# Patient Record
Sex: Female | Born: 1950 | Race: White | Hispanic: No | Marital: Married | Smoking: Former smoker
Health system: Southern US, Community
[De-identification: ages and names within clinical notes are randomized; demographics above are authoritative.]

## PROBLEM LIST (undated history)

## (undated) DIAGNOSIS — G4733 Obstructive sleep apnea (adult) (pediatric): Secondary | ICD-10-CM

## (undated) DIAGNOSIS — I10 Essential (primary) hypertension: Secondary | ICD-10-CM

## (undated) DIAGNOSIS — I493 Ventricular premature depolarization: Secondary | ICD-10-CM

## (undated) DIAGNOSIS — G8929 Other chronic pain: Secondary | ICD-10-CM

## (undated) DIAGNOSIS — M791 Myalgia, unspecified site: Secondary | ICD-10-CM

## (undated) DIAGNOSIS — M549 Dorsalgia, unspecified: Secondary | ICD-10-CM

## (undated) DIAGNOSIS — N939 Abnormal uterine and vaginal bleeding, unspecified: Secondary | ICD-10-CM

## (undated) DIAGNOSIS — J300 Vasomotor rhinitis: Secondary | ICD-10-CM

## (undated) DIAGNOSIS — R600 Localized edema: Secondary | ICD-10-CM

## (undated) DIAGNOSIS — M797 Fibromyalgia: Secondary | ICD-10-CM

## (undated) DIAGNOSIS — R0789 Other chest pain: Secondary | ICD-10-CM

## (undated) DIAGNOSIS — K449 Diaphragmatic hernia without obstruction or gangrene: Secondary | ICD-10-CM

## (undated) DIAGNOSIS — R002 Palpitations: Secondary | ICD-10-CM

## (undated) DIAGNOSIS — E7800 Pure hypercholesterolemia, unspecified: Secondary | ICD-10-CM

## (undated) DIAGNOSIS — G44019 Episodic cluster headache, not intractable: Secondary | ICD-10-CM

## (undated) HISTORY — DX: Abnormal uterine and vaginal bleeding, unspecified: N93.9

## (undated) HISTORY — PX: LAPAROSCOPIC CHOLECYSTECTOMY: SUR755

## (undated) HISTORY — DX: Fibromyalgia: M79.7

## (undated) HISTORY — PX: HERNIA REPAIR: SHX51

## (undated) HISTORY — DX: Ventricular premature depolarization: I49.3

## (undated) HISTORY — PX: BACK SURGERY: SHX140

## (undated) HISTORY — DX: Essential (primary) hypertension: I10

## (undated) HISTORY — DX: Other chronic pain: G89.29

## (undated) HISTORY — PX: KNEE SURGERY: SHX244

## (undated) HISTORY — PX: THUMB ARTHROSCOPY: SHX2509

## (undated) HISTORY — PX: ABDOMINAL HYSTERECTOMY: SUR658

## (undated) HISTORY — DX: Diaphragmatic hernia without obstruction or gangrene: K44.9

## (undated) HISTORY — PX: DILATION AND CURETTAGE OF UTERUS: SHX78

## (undated) HISTORY — DX: Pure hypercholesterolemia, unspecified: E78.00

## (undated) HISTORY — DX: Dorsalgia, unspecified: M54.9

---
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
## 1998-05-21 ENCOUNTER — Ambulatory Visit (HOSPITAL_COMMUNITY): Admission: RE | Admit: 1998-05-21 | Discharge: 1998-05-21 | Payer: Self-pay | Admitting: Gastroenterology

## 1998-06-20 ENCOUNTER — Other Ambulatory Visit: Admission: RE | Admit: 1998-06-20 | Discharge: 1998-06-20 | Payer: Self-pay | Admitting: *Deleted

## 1999-01-15 ENCOUNTER — Other Ambulatory Visit: Admission: RE | Admit: 1999-01-15 | Discharge: 1999-01-15 | Payer: Self-pay | Admitting: *Deleted

## 2000-08-11 ENCOUNTER — Other Ambulatory Visit: Admission: RE | Admit: 2000-08-11 | Discharge: 2000-08-11 | Payer: Self-pay | Admitting: *Deleted

## 2000-09-22 ENCOUNTER — Ambulatory Visit (HOSPITAL_COMMUNITY): Admission: RE | Admit: 2000-09-22 | Discharge: 2000-09-22 | Payer: Self-pay | Admitting: Internal Medicine

## 2000-10-19 ENCOUNTER — Ambulatory Visit (HOSPITAL_COMMUNITY): Admission: RE | Admit: 2000-10-19 | Discharge: 2000-10-19 | Payer: Self-pay | Admitting: Gastroenterology

## 2000-10-19 ENCOUNTER — Encounter: Payer: Self-pay | Admitting: Gastroenterology

## 2001-08-03 ENCOUNTER — Ambulatory Visit (HOSPITAL_COMMUNITY): Admission: RE | Admit: 2001-08-03 | Discharge: 2001-08-03 | Payer: Self-pay | Admitting: Gastroenterology

## 2001-08-03 ENCOUNTER — Encounter: Payer: Self-pay | Admitting: Gastroenterology

## 2001-08-10 ENCOUNTER — Encounter: Admission: RE | Admit: 2001-08-10 | Discharge: 2001-08-10 | Payer: Self-pay | Admitting: Gastroenterology

## 2001-08-10 ENCOUNTER — Encounter: Payer: Self-pay | Admitting: Gastroenterology

## 2001-10-12 ENCOUNTER — Other Ambulatory Visit: Admission: RE | Admit: 2001-10-12 | Discharge: 2001-10-12 | Payer: Self-pay | Admitting: *Deleted

## 2002-02-01 ENCOUNTER — Inpatient Hospital Stay (HOSPITAL_COMMUNITY): Admission: AD | Admit: 2002-02-01 | Discharge: 2002-02-05 | Payer: Self-pay | Admitting: Internal Medicine

## 2002-02-02 ENCOUNTER — Encounter: Payer: Self-pay | Admitting: Internal Medicine

## 2002-09-18 ENCOUNTER — Other Ambulatory Visit: Admission: RE | Admit: 2002-09-18 | Discharge: 2002-09-18 | Payer: Self-pay | Admitting: *Deleted

## 2003-02-18 ENCOUNTER — Encounter: Payer: Self-pay | Admitting: *Deleted

## 2003-02-20 ENCOUNTER — Encounter (INDEPENDENT_AMBULATORY_CARE_PROVIDER_SITE_OTHER): Payer: Self-pay | Admitting: Specialist

## 2003-02-20 ENCOUNTER — Inpatient Hospital Stay (HOSPITAL_COMMUNITY): Admission: AD | Admit: 2003-02-20 | Discharge: 2003-02-22 | Payer: Self-pay | Admitting: *Deleted

## 2003-07-09 ENCOUNTER — Encounter: Admission: RE | Admit: 2003-07-09 | Discharge: 2003-07-09 | Payer: Self-pay | Admitting: *Deleted

## 2003-07-31 ENCOUNTER — Encounter (INDEPENDENT_AMBULATORY_CARE_PROVIDER_SITE_OTHER): Payer: Self-pay | Admitting: Specialist

## 2003-07-31 ENCOUNTER — Ambulatory Visit (HOSPITAL_COMMUNITY): Admission: RE | Admit: 2003-07-31 | Discharge: 2003-07-31 | Payer: Self-pay | Admitting: Gastroenterology

## 2003-09-25 ENCOUNTER — Other Ambulatory Visit: Admission: RE | Admit: 2003-09-25 | Discharge: 2003-09-25 | Payer: Self-pay | Admitting: *Deleted

## 2003-12-16 ENCOUNTER — Ambulatory Visit (HOSPITAL_COMMUNITY): Admission: RE | Admit: 2003-12-16 | Discharge: 2003-12-16 | Payer: Self-pay | Admitting: Surgery

## 2003-12-16 ENCOUNTER — Ambulatory Visit (HOSPITAL_BASED_OUTPATIENT_CLINIC_OR_DEPARTMENT_OTHER): Admission: RE | Admit: 2003-12-16 | Discharge: 2003-12-16 | Payer: Self-pay | Admitting: Surgery

## 2003-12-16 ENCOUNTER — Encounter (INDEPENDENT_AMBULATORY_CARE_PROVIDER_SITE_OTHER): Payer: Self-pay | Admitting: Specialist

## 2004-10-14 ENCOUNTER — Other Ambulatory Visit: Admission: RE | Admit: 2004-10-14 | Discharge: 2004-10-14 | Payer: Self-pay | Admitting: *Deleted

## 2005-12-01 ENCOUNTER — Other Ambulatory Visit: Admission: RE | Admit: 2005-12-01 | Discharge: 2005-12-01 | Payer: Self-pay | Admitting: *Deleted

## 2006-11-09 ENCOUNTER — Ambulatory Visit: Payer: Self-pay

## 2006-12-21 ENCOUNTER — Other Ambulatory Visit: Admission: RE | Admit: 2006-12-21 | Discharge: 2006-12-21 | Payer: Self-pay | Admitting: *Deleted

## 2007-04-25 ENCOUNTER — Ambulatory Visit (HOSPITAL_COMMUNITY): Admission: RE | Admit: 2007-04-25 | Discharge: 2007-04-25 | Payer: Self-pay | Admitting: Internal Medicine

## 2007-08-21 ENCOUNTER — Encounter: Admission: RE | Admit: 2007-08-21 | Discharge: 2007-08-21 | Payer: Self-pay | Admitting: Internal Medicine

## 2007-12-21 ENCOUNTER — Other Ambulatory Visit: Admission: RE | Admit: 2007-12-21 | Discharge: 2007-12-21 | Payer: Self-pay | Admitting: Gynecology

## 2009-10-29 ENCOUNTER — Encounter: Admission: RE | Admit: 2009-10-29 | Discharge: 2009-10-29 | Payer: Self-pay | Admitting: Specialist

## 2009-12-20 ENCOUNTER — Encounter: Admission: RE | Admit: 2009-12-20 | Discharge: 2009-12-20 | Payer: Self-pay | Admitting: Specialist

## 2009-12-24 ENCOUNTER — Ambulatory Visit (HOSPITAL_COMMUNITY): Admission: AD | Admit: 2009-12-24 | Discharge: 2009-12-25 | Payer: Self-pay | Admitting: Specialist

## 2009-12-25 ENCOUNTER — Encounter (INDEPENDENT_AMBULATORY_CARE_PROVIDER_SITE_OTHER): Payer: Self-pay | Admitting: Specialist

## 2010-04-28 LAB — PROTIME-INR
INR: 0.9 (ref 0.00–1.49)
Prothrombin Time: 12.4 seconds (ref 11.6–15.2)

## 2010-04-28 LAB — COMPREHENSIVE METABOLIC PANEL
ALT: 34 U/L (ref 0–35)
AST: 23 U/L (ref 0–37)
Albumin: 3.6 g/dL (ref 3.5–5.2)
Alkaline Phosphatase: 50 U/L (ref 39–117)
BUN: 14 mg/dL (ref 6–23)
CO2: 32 mEq/L (ref 19–32)
Calcium: 8.7 mg/dL (ref 8.4–10.5)
Chloride: 97 mEq/L (ref 96–112)
Creatinine, Ser: 0.92 mg/dL (ref 0.4–1.2)
GFR calc Af Amer: >60 mL/min (ref >60)
GFR calc non Af Amer: >60 mL/min (ref >60)
Glucose, Bld: 96 mg/dL (ref 70–99)
Potassium: 3.4 mEq/L — ABNORMAL LOW (ref 3.5–5.1)
Sodium: 139 mEq/L (ref 135–145)
Total Bilirubin: 0.8 mg/dL (ref 0.3–1.2)
Total Protein: 7.1 g/dL (ref 6.0–8.3)

## 2010-04-28 LAB — BASIC METABOLIC PANEL
BUN: 11 mg/dL (ref 6–23)
CO2: 28 mEq/L (ref 19–32)
Calcium: 8.5 mg/dL (ref 8.4–10.5)
Chloride: 103 mEq/L (ref 96–112)
Creatinine, Ser: 0.84 mg/dL (ref 0.4–1.2)
GFR calc Af Amer: >60 mL/min (ref >60)
GFR calc non Af Amer: >60 mL/min (ref >60)
Glucose, Bld: 144 mg/dL — ABNORMAL HIGH (ref 70–99)
Potassium: 4.6 mEq/L (ref 3.5–5.1)
Sodium: 139 mEq/L (ref 135–145)

## 2010-04-28 LAB — URINALYSIS, ROUTINE W REFLEX MICROSCOPIC
Bilirubin Urine: NEGATIVE
Glucose, UA: NEGATIVE mg/dL
Hgb urine dipstick: NEGATIVE
Ketones, ur: NEGATIVE mg/dL
Nitrite: NEGATIVE
Protein, ur: NEGATIVE mg/dL
Specific Gravity, Urine: 1.008 (ref 1.005–1.030)
Urobilinogen, UA: 0.2 mg/dL (ref 0.0–1.0)
pH: 7 (ref 5.0–8.0)

## 2010-04-28 LAB — SURGICAL PCR SCREEN
MRSA, PCR: NEGATIVE
Staphylococcus aureus: NEGATIVE

## 2010-04-28 LAB — CBC
HCT: 39.9 % (ref 36.0–46.0)
HCT: 44.6 % (ref 36.0–46.0)
Hemoglobin: 14 g/dL (ref 12.0–15.0)
Hemoglobin: 15.5 g/dL — ABNORMAL HIGH (ref 12.0–15.0)
MCH: 31.4 pg (ref 26.0–34.0)
MCH: 31.8 pg (ref 26.0–34.0)
MCHC: 34.7 g/dL (ref 30.0–36.0)
MCHC: 35.1 g/dL (ref 30.0–36.0)
MCV: 90.5 fL (ref 78.0–100.0)
MCV: 90.7 fL (ref 78.0–100.0)
Platelets: 158 10*3/uL (ref 150–400)
Platelets: 160 10*3/uL (ref 150–400)
RBC: 4.4 MIL/uL (ref 3.87–5.11)
RBC: 4.92 MIL/uL (ref 3.87–5.11)
RDW: 13.9 % (ref 11.5–15.5)
RDW: 13.9 % (ref 11.5–15.5)
WBC: 12.1 10*3/uL — ABNORMAL HIGH (ref 4.0–10.5)
WBC: 8.6 10*3/uL (ref 4.0–10.5)

## 2010-04-28 LAB — APTT: aPTT: 25 seconds (ref 24–37)

## 2010-06-19 ENCOUNTER — Other Ambulatory Visit: Payer: Self-pay | Admitting: Specialist

## 2010-06-19 DIAGNOSIS — M545 Low back pain, unspecified: Secondary | ICD-10-CM

## 2010-06-23 ENCOUNTER — Ambulatory Visit
Admission: RE | Admit: 2010-06-23 | Discharge: 2010-06-23 | Disposition: A | Discharge status: Home / Self Care | Payer: 59 | Source: Ambulatory Visit | Attending: Specialist | Admitting: Specialist

## 2010-06-23 DIAGNOSIS — M545 Low back pain, unspecified: Secondary | ICD-10-CM

## 2010-06-23 MED ORDER — GADOBENATE DIMEGLUMINE 529 MG/ML IV SOLN
15.0000 mL | Freq: Once | INTRAVENOUS | Status: AC | PRN
  Administered 2010-06-23: 15 mL via INTRAVENOUS

## 2010-07-03 NOTE — H&P (Signed)
Katherine Pugh, Katherine Pugh                          ACCOUNT NO.:  [id number]   MEDICAL RECORD NO.:  [id number]                   PATIENT TYPE:  AMB   LOCATION:  DAY                                  FACILITY:  [facility]   PHYSICIAN:  Almedia Balls. Fore, [name]                DATE OF BIRTH:  Oct 17, 1950   DATE OF ADMISSION:  02/20/2003  DATE OF DISCHARGE:                                HISTORY & PHYSICAL   CHIEF COMPLAINT:  Abnormal bleeding, pelvic pain, uterine enlargement.   HISTORY:  The patient is a 60 year old with the above-noted problems, who  has been followed in our office for many years.  She underwent hysteroscopy  and D&C in November of 2003 because of the abnormal bleeding, and was found  to have benign changes.  Laparoscopy done in 2000 showed uterus enlargement  to 12-14 weeks size, and ultrasounds consistent with, and suspicious for,  adenomyosis.  She has continued to have progressive problems with her  periods in that they are heavier each month and more painful.  Recent exam  showed the uterus to be enlarged over the previous 12+ weeks size.  A Pap  smear was normal in August of 2004.  She is admitted at this time for a  hysterectomy, possible bilateral salpingo-oophorectomy.  She has been fully  counseled as to the nature of the procedure and the risks involved to  include risks of anesthesia, injury to bowel, bladder, blood vessels,  ureters, postoperative hemorrhage, infection, recuperation, and possible  hormone therapy should her ovaries be removed.  She fully understands all  these considerations, and wishes to proceed on February 20, 2003.   PAST MEDICAL HISTORY:  Includes the above-noted surgery, plus cesarean  sections x2.  She has taken nonsteroidal anti-inflammatory medications for  pain and bleeding without success.  She has been on Altace for hypertension,  but is now off this, and is on hydrochlorothiazide only for hypertension.   ALLERGIES:  She is allergic to no  known medications.   SOCIAL HISTORY:  She is a nonsmoker.   FAMILY HISTORY:  Includes a brother with lung cancer, a second brother with  lung cancer, a sister with choriocarcinoma, a paternal cousin with cancer of  the breast, and a paternal uncle with cancer of the colon.   REVIEW OF SYSTEMS:  HEENT:  Wears glasses.  CARDIORESPIRATORY:  Irregular  rhythm at times, for which she has been worked up by Dr. Graciela Husbands, who found no  definite pathologic problems for this.  GASTROINTESTINAL:  Negative.  GENITOURINARY:  As in present illness.  NEUROMUSCULAR:  Negative.   PHYSICAL EXAMINATION:  VITAL SIGNS:  Height 5' 4.5% tall, weight 182 pounds,  blood pressure 138/74, pulse 84 and somewhat irregular, respirations 18.  GENERAL:  Well-developed white female in no acute distress.  HEENT:  Within normal limits.  NECK:  Supple without masses, adenopathy, or bruits.  HEART:  Irregularly irregular rhythm without murmur.  LUNGS:  Clear to percussion and auscultation.  BREASTS:  Breasts examined sitting and lying, without mass.  Axilla  negative.  ABDOMEN:  Flat and soft with slight mass effect in the lower central  abdominal area.  Slightly tender.  PELVIC:  External genitalia, Bartholin, urethral, and Skene glands within  normal limits.  Cervix slightly inflamed.  Uterus is mid-position, 16 weeks  plus size, and nontender.  There were no palpable adnexal masses.  Anterior  and posterior cul-de-sac exam is confirmatory.  EXTREMITIES:  Within normal limits.  CENTRAL NERVOUS SYSTEM:  Grossly intact.   IMPRESSION:  1. Abnormal uterine bleeding.  2. Uterine enlargement.  3. Pelvic pain.   DISPOSITION:  As noted above.                                               Almedia Balls. Randell Patient, [name]    SRF/MEDQ  D:  02/14/2003  T:  02/14/2003  Job:  [id number]

## 2010-07-03 NOTE — H&P (Signed)
Katherine Pugh, BALLI                          ACCOUNT NO.:  [id number]   MEDICAL RECORD NO.:  [id number]                   PATIENT TYPE:  INP   LOCATION:  3040                                 FACILITY:  [facility]   PHYSICIAN:  Gwen Pounds, [name]                 DATE OF BIRTH:  1950/09/22   DATE OF ADMISSION:  02/01/2002  DATE OF DISCHARGE:                                HISTORY & PHYSICAL   PRIMARY CARE PHYSICIAN:  [name], [name]   PRIMARY GASTROENTEROLOGIST:  [name], [name]   PRIMARY CARDIOLOGIST:  [name], [name]   PRIMARY ONCOLOGIST:  [name], [name]   CHIEF COMPLAINT:  Fever, lymphadenopathy, rash, dehydration, failure to  thrive, weakness, and hypokalemia.   HISTORY OF PRESENT ILLNESS:  This 60 year old female with hypertension,  goiter, achalasia, and hyperlipidemia has been sick for approximately two  weeks.  She came in Thursday with three to four days of left auricular  lymphadenopathy with facial swelling and diagnosed with sinusitis.  She was  placed on Augmentin, Nasacort, and Allevert, which did not help.  She went  out of town on Friday and developed a temperature of 102+ degrees with  chills.  She was taking ibuprofen 800 mg t.i.d. just to stay functional.  Because the symptoms worsened over the weekend, including worsening  lymphadenopathy, she came to see [name], [name], on January 29, 2002, with  a stiff neck, swollen face, myalgias, and rash on her legs.  The rash was  believed secondary to Augmentin and the Augmentin was discontinued.  Dr.  Felipa Eth called infectious disease and ears, nose, and throat.  The patient went  to see [name], [name], later that afternoon.  It was decided to  check labs and placed her on Bextra, Zyrtec, Levaquin, and Vicodin.  She was  offered admission per Dr. Annalee Genta, but was interested in outpatient work-  up at the time.  Today on February 01, 2002, she is weaker with increasing  swelling, increasing  rash, and increasing lymphadenopathy.  The lymph nodes  are now cervical, axillary, and periauricular.  Also, at the left breast  there is a spontaneous bleeding that has popped up.  She is clinically  dehydrated and need of inpatient admission.  Also, she is hypokalemic and  that needs to be replaced.  She will need other further evaluation and  treatment.  Other things that she reports are fevers, easy bruising, ulcers  in the mouth, and a 5-pound weight loss.   PAST MEDICAL HISTORY:  1. Endometriosis.  2. History of 10 negative breast biopsies.  3. Goiter.  4. Achalasia.  5. Cholecystectomy with pancreatitis.  6. Allergic rhinitis.  7. Hypertension.  8. Hyperlipidemia.  9. C-sections.   ALLERGIES:  No known drug allergies.   MEDICATIONS:  1. Hydrochlorothiazide 25 mg q.d.  2. Aspirin 81 mg q.d.  3. Nasacort  q.h.s.  4. Bextra 10 mg q.d.  5. Levaquin 500 mg q.d.  6. Zyrtec.  7. Vicodin.   SOCIAL HISTORY:  She is married.  She quit tobacco.  No alcohol.  Six  children.   FAMILY HISTORY:  Her father died at the age of 48 of lung cancer.  Two  brothers have also died of lung cancer.  One brother died of liver cancer.  Her mother died at the age of 10 of a stroke and hypertension.  There are  also kidney stones in the family.   REVIEW OF SYSTEMS:  Again, fevers, night sweats, recent weight loss, recent  sharp chest pains, recent left breast lesion without nodules, and recent  elevated lymphadenopathy and edema.  She has had a history of CT scans of  the chest and MRIs of the liver which have been negative.  Colonoscopy done  in April of 2000 showed small polyps, but otherwise was negative.  She sees  [name], [name], for liver cysts.  She had an alpha-fetoprotein of 2.4  approximately a year ago.  She denies any other ears, nose, and throat  abnormalities.  No other chest abnormalities.  No other lung, abdomen,  pelvic, or lower extremity extremities, except for the  fact that she is just  very weak and she is not making much in the way of urine, she has diffuse  myalgias, and she has the rash on her lower extremities.  All other organ  systems as stated above have been reviewed.   PHYSICAL EXAMINATION:  VITAL SIGNS:  Blood pressure 138/80, heart rate 84,  temperature 98.8 degrees.  WEIGHT:  192 pounds.  GENERAL APPEARANCE:  Alert and oriented x 3.  No specific distress, but she  does appear ill.  HEENT:  PERRL.  EOMI.  The tympanic membranes are fine.  The nares and  sinuses are clear.  The oropharynx is clear.  She does have some minor  ulcerations on her tongue and lips.  She does have left periauricular lymph  node swelling.  Her face is red and there is some edema.  LYMPHATICS:  She has left cervical lymphadenopathy.  She has left axillary  lymphadenopathy.  CHEST:  Clear to auscultation bilaterally.  CARDIAC:  Regular without murmurs, rubs, or gallops.  BREASTS:  No nodules, but at 5 o'clock on the left breast, there is a small  ulcer abrasion with a scab.  ABDOMEN:  Soft, nontender, and nondistended.  Bowel sounds positive x 4.  There are surgical scars noted.  EXTREMITIES:  No clubbing, but there is some edema.  There is a  maculopapular rash on bilateral lower extremities.  Her strength is  appropriate.  Reflexes are fine.  She is moving all four extremities  appropriately.  Her upper extremities show some small nodules and bruising.  NECK:  No meningeal signs.   ANCILLARY DATA:  Sodium 137, potassium 3.8, chloride 99, bicarbonate 32, BUN  11, creatinine 0.6, chloride 87.  The white blood cell count is 8.7 with 82%  segs, 12% lymphs, hemoglobin 12.3, and platelet count 193.  LFTs are fine.  She had a mumps IgG which was 3.35 and IgM which was 0.6.  Sedimentation  rate of 80.  The EKG showed a normal sinus rhythm with one PVC and no major  changes, except for the PVC from March of 2000.  ASSESSMENT:  An ill-appearing, 60 year old, black  female with dehydration,  rash, lymphadenopathy, failure to thrive, hypokalemia, and recent fevers.  PLAN:  1. Admit.  2. IV fluids with potassium to correct dehydration and potassium deficiency.  3. Cultures.  4. I believe lymphoma is high on the differential diagnosis and she will get     CT of the chest, abdomen, and pelvis tonight, so the possibilities     include getting a general surgeon to think about lymph node excision and     biopsy versus getting ID versus getting oncology to see her.  Will wait     for the CT results to be finalized before deciding on course.  She also     may have an atypical infection.  Cultures have been ordered.  Titers of     several different viral infections have been ordered, including CMV, EBV,     [facility] spotted fever, and Bartonella.  Bextra and     hydrochlorothiazide on hold in case of sulfa allergy.  5. With her breast lesion, she may need a mammography for further work-up.  6. Her blood pressure is fine.  7. Pain control.  8. After discussion with an oncologist, an LDH was ordered.                                               Gwen Pounds, [name]    JMR/MEDQ  D:  02/02/2002  T:  02/03/2002  Job:  [id number]   cc:   [name], [name]  [address]  [place]  [place] [address]  Fax: [phone]   [name], [name] [facility] R. Myna Hidalgo, [name]  [address] [facility]  [place], [place] [address]  Fax: [phone]

## 2010-07-03 NOTE — Op Note (Signed)
[name], Katherine Pugh                ACCOUNT NO.:  [id number]   MEDICAL RECORD NO.:  [id number]          PATIENT TYPE:  AMB   LOCATION:  DSC                          FACILITY:  [facility]   PHYSICIAN:  Currie Paris, M.D.DATE OF BIRTH:  03/04/1950   DATE OF PROCEDURE:  12/16/2003  DATE OF DISCHARGE:                                 OPERATIVE REPORT   OFFICE MRN:  [id number]   PREOPERATIVE DIAGNOSIS:  Ventral incisional hernia.   POSTOPERATIVE DIAGNOSIS:  Ventral incisional hernia.   PROCEDURE:  Repair with mesh.   SURGEON:  Currie Paris, [name]   ANESTHESIA:  General endotracheal anesthesia.   BRIEF HISTORY:  The patient had presented with a gradually enlarging lower  abdominal midline hernia secondary to a prior cesarean section Pfannenstiel  incision.  She would like to have this repaired and we are planning an open  repair with an abdominoplasty by Dr. Benna Dunks.   DESCRIPTION OF PROCEDURE:  The patient was seen in the holding area and she  had no further questions.  She was taken to the operating room and after  satisfactory general endotracheal anesthesia had been obtained the abdomen  was prepped and draped.  Dr. Benna Dunks made the initial incision by making a  long lower abdominal incision and raising an abdominoplasty flap all the way  to the xiphoid and costocartilage.  The hernia was well visualized and was  in the lower midline and was 3 cm wide x 6 cm long. I excised the excess  sac.  There is no fat or anything else incarcerated within it.   I did a direct closure using interrupted 0 Prolene.  These were tied as they  were placed and alternate ones left uncut.   I took a 3 x 6 inch piece of mesh, cut a strip from this and threaded the  uncut sutures through those and tied them down to anchor the mesh to the  repair directly.   At this point, Dr. Benna Dunks did his usual imbrication of the fascia of the  lower abdomen imbricating over the repair.  At this point, again  we tied  those, but left alternate ones uncut and I now took another piece of 3 x 6  inch mesh with this one going about 3 to 4 cm around the repair in all  directions and anchored it to the primary midline closure and then anchored  it with additional Prolene laterally at the edges.   This produced a nice repair and there was no tension.  Dr. Benna Dunks completed  the procedure of abdominoplasty and closed.      Chri   CJS/MEDQ  D:  12/16/2003  T:  12/16/2003  Job:  [id number]   cc:   Alfredia Ferguson, [name]  [address] [address]  [place]  [place] [address]  Fax: [phone]   [name], [name]  [address]  [place]  [place] [address]  Fax: [phone]

## 2010-07-03 NOTE — Discharge Summary (Signed)
Katherine Pugh, Katherine Pugh                          ACCOUNT NO.:  [id number]   MEDICAL RECORD NO.:  [id number]                   PATIENT TYPE:  INP   LOCATION:  3040                                 FACILITY:  [facility]   PHYSICIAN:  [name], [name]                     DATE OF BIRTH:  1950-06-10   DATE OF ADMISSION:  02/01/2002  DATE OF DISCHARGE:  02/05/2002                                 DISCHARGE SUMMARY   DISCHARGE DIAGNOSES:  1. Fever associated with papulosquamous rash, oral ulcers, arthralgias,     severe, secondary to probable viral origin.  2. Preauricular and cervical lymphadenopathy on the left.  3. Hypokalemia.  4. Hypertension.  5. Dehydration.  6. Vaginal candidiasis.   DISCHARGE MEDICATIONS:  1. Hydrochlorothiazide 25 mg p.o. every day to start on February 07, 2002.  2. Nasacort and Zyrtec as prior to admission.  3. Aspirin 81 mg p.o. every day.  4. Vicodin or Tylenol one to two tablets every six hours as needed.  5. Discontinue all antibiotics.  6. Diflucan 150 mg x1 on February 07, 2002 if no resolution of yeast     candidiasis.   SPECIAL INSTRUCTIONS:  To call Dr. [name] if she has any high fever,  worsening rash or lymph node swelling   FOLLOWUP:  Is to follow up with Dr. Felipa Eth on February 12, 2002, in the  afternoon, at which time she will need reevaluation of her hypokalemia,  blood counts.  She is also to call Dr. Cliffton Asters in January 2004 if  desired and needed to review her serological results that are pending and  her clinical progress.   LABORATORY EVALUATION:  Discharge BMET:  Sodium 137, potassium 4.9, chloride  107, CO2 28, glucose 101, BUN 6, creatinine 0.7, calcium 8.4.  White blood  cell count 6.3, hemoglobin 10.6, hematocrit 31.6%, platelet count 246,000.  Admission white blood cell count 8.1, hemoglobin 11.2, hematocrit 33%.  Admission potassium 2.6, admission BUN 11, admission creatinine 0.8, albumin  2.9, total protein 6.9, AST 24, ALT 32,  alkaline phosphatase 75, total  bilirubin 0.4, LDH 163.  CK 56, CK-MB 1.0.  CMV IgG antibody 0.23, which is  negative per reference interval.  AFP tumor marker 3.2, within normal range.  Urinalysis unremarkable for infection or proteinuria.  Blood cultures x2 on  February 01, 2002 negative with the exception of one out of two being  positive for gram-positive cocci in clusters.  Urine culture unremarkable.  Pending labs include, but are not limited to:  Bartonella antibodies, [facility] spotted fever antibodies panel, EBV antibody panel.   CTs of the abdomen and pelvis and chest performed on February 02, 2002 are  essentially unremarkable with the exception of scattered hepatic cysts that  are present on prior MRI scans.   HISTORY OF THE PRESENT ILLNESS:  This is a 60 year old Caucasian female  who  has a past medical history significant for hypertension, goiter, achalasia,  hyperlipidemia and a significant family history for early malignancy, who  presented to our office on at least three different occasions with worsening  left preauricular lymphadenopathy with facial swelling, with extension into  the anterior cervical lymph node change.  This was associated with fever,  decreased oral intake, weakness, diffuse arthralgias and myalgias and  beginning oral ulcers.  She also developed a maculopapular rash that was  predominantly located on the lower extremities and was extending to the  upper extremities and upper torso without discharge.  She was evaluated by  Dr. Onalee Hua L. Shoemaker to rule out possible abscess and/or salivary gland  involvement.  This examination was unremarkable with the exception of her  lymphadenopathy and he further recommended possible admission, which the  patient deferred at that time, as well as antibiotics, to continue on  fluoroquinolones consisting of Levaquin or Tequin.  Augmentin, which was  originally started given her lymphadenopathy, was  discontinued, given  questions of antibiotic reaction or adverse drug reaction.  The patient's  symptoms progressed and the patient was admitted by my partner on February 01, 2002, given her hypokalemia, dehydration and worsening rash.   HOSPITAL COURSE:  The patient was admitted on February 01, 2002 and started  on IV fluids, IV antibiotics, with multiple viral titers obtained, as above.  Given her strong family history of malignancy and her lymphadenopathy, CT  scanning of the chest, abdomen and pelvis was performed, which was  unremarkable for any lymphadenopathy or cancerous etiology.  Infectious  disease consultation was requested from Dr. Cliffton Asters, who graciously  performed a full evaluation and thought that her symptom complex was also  consistent with viral etiology.  He further thought that the lesion on her  left axilla and left breast were the same as the generalized lesions that  were present on her upper torso and extremities.  He also thought that  enteroviral infection is high on the list of differential diagnoses.  He  recommended at that time continuing antibiotics, observing for evolution of  rash and persistent fever and checking a total CK, which is normal as above.  Over the next two to three days, the patient's fever decreased, there were  no additional skin lesions developed, preauricular and cervical lymph node  swelling seemed to resolve.  The patient was taking 100% of her meals.  Intravenous fluids and antibiotics were discontinued.  On the day of  discharge, the patient was still complaining of joint aches, myalgias,  headaches at time, but stated that her caloric intake was satisfactory and  was comfortable with discharged to home under the care of her family.  Again, multiple fears of malignancy were discussed with reassurance.  The  patient was satisfied with close followup as described above, along with possible phone discussion with Dr. Orvan Falconer after  discharge.                                               [name], [name]    RA/MEDQ  D:  02/05/2002  T:  02/06/2002  Job:  [id number]   cc:   Cliffton Asters, [name]  [address] [place]  [place] [address]  Fax: [phone]   [name], [name]  [address]  [address]  [place] [address]  Fax: [phone]   [name], [name] [facility]

## 2010-07-03 NOTE — Op Note (Signed)
NAMEDENICIA, PAGLIARULO                          ACCOUNT NO.:  [id number]   MEDICAL RECORD NO.:  [id number]                   PATIENT TYPE:  AMB   LOCATION:  ENDO                                 FACILITY:  [facility]   PHYSICIAN:  [name], [name]                 DATE OF BIRTH:  [birth year]   DATE OF PROCEDURE:  07/31/2003  DATE OF DISCHARGE:                                 OPERATIVE REPORT   PROCEDURE:  Colonoscopy.   INDICATION:  Patient with a family history of colon polyps, probable family  history of colon cancer, due for colonic screening.   Consent was signed after risks, benefits, methods, options thoroughly  discussed in the office on multiple occasions.   MEDICINES USED:  Demerol 100, Versed 9.   PROCEDURE:  Rectal inspection was pertinent for external hemorrhoids.  Small  digital exam was negative.  A video pediatric adjustable colonoscope was  inserted and with lots of difficulty, due to a tortuous sigmoid, once  through this area was easily able to advance to the hepatic flexure.  Unfortunately, there was more __________ tortuosity there and despite  rolling her on all three positions it was very difficult to advance through  this area.  Finally, with her on her left side we were able to withdraw all  the way back to probably the splenic flexure, with pelvic abdominal pressure  able to be advanced to the cecum.  No obvious abnormality was seen on  insertion.  The cecum was identified by the appendiceal orifice and  ileocecal valve.  In fact, the scope was inserted a short ways into the  terminal ileum which was normal.  Photo documentation was obtained, the  scope was slowly withdrawn.  The prep was adequate.  There was some liquid  stool that required washing and suctioning.  In the cecum was a small linear  polyp which was cold biopsied x4.  The scope was further withdrawn.  Her  ascending and hepatic flexure were tortuous.  When we fell back around that  loop we could  not readvance around it, because of the difficulties mentioned  above, so we elected to withdraw.  Other than a tiny sigmoid polyp which was  cold biopsied, no additional findings were seen.  Again, her sigmoid was  tortuous.  Once back in the rectum, anorectal pull through on retroflexion  confirmed some small hemorrhoids.  The scope was straightened, air was  suctioned, the scope removed.  Patient tolerated the procedure well.  There  was no obvious immediate complication.   ENDOSCOPIC DIAGNOSES:  1. Internal and external tiny hemorrhoids.  2. Tortuous sigmoid, tortuous hepatic flexure.  3. Two questionable tiny to small polyps in the cecum in the distal sigmoid     colon, biopsied.  4. Otherwise within normal limits in the terminal ileum.   PLAN:  Await pathology to determine future colonic screening.  Might  consider a virtual colonoscopy next time screening is needed based on  tortuosity.  Happy to see back p.r.n.  Otherwise, __________ hernia workup  per Dr. Jamey Ripa.                                               [name], [name]    MEM/MEDQ  D:  07/31/2003  T:  07/31/2003  Job:  [id number]   cc:   Almedia Balls. Fore, [name]  [phone] [address] [place]  [place] [address]  Fax: [phone]   [name], [name]  [address]  [place]  [place] [address]  Fax: [phone]   Currie Paris, [name]  [address]  [place]  [place] [address]  Fax: [phone]

## 2010-07-03 NOTE — Op Note (Signed)
NAMECATRINA, FELLENZ                ACCOUNT NO.:  [id number]   MEDICAL RECORD NO.:  [id number]          PATIENT TYPE:  AMB   LOCATION:  DSC                          FACILITY:  [facility]   PHYSICIAN:  Alfredia Ferguson, [name]  DATE OF BIRTH:  November 05, 1950   DATE OF PROCEDURE:  12/16/2003  DATE OF DISCHARGE:                                 OPERATIVE REPORT   PREOPERATIVE DIAGNOSES:  1.  Ventral hernia.  2.  Excess skin and fat of abdomen.   POSTOPERATIVE DIAGNOSES:  1.  Ventral hernia.  2.  Excess skin and fat of abdomen.   OPERATION PERFORMED:  1.  Ventral hernia repair (to be dictated by Dr. Cicero Duck).  2.  Abdominoplasty with rectus fascial plication.   SURGEON:  Alfredia Ferguson, [name]   FIRST ASSISTANT:  Vevelyn Francois.   ANESTHESIA:  General endotracheal anesthesia.   INDICATION FOR SURGERY:  This is a 60 year old woman with an approximately 6  cm ventral hernia between the umbilicus and the suprapubic area.  She is  going to be undergoing a ventral hernia repair and wishes to undergo an  abdominoplasty.  She was counseled regarding the potential risks of the  surgery, including being pulled too tight, not being pulled tightly enough,  dog ears, infection, bleeding, hematoma, seroma, prolonged drainage, pain,  numbness to the skin, vascular compromise through the umbilicus, asymmetry  of the incision and overall dissatisfaction with the results.  In spite of  that, the patient wishes to proceed with this surgery.   DESCRIPTION OF OPERATION:  The skin marks were placed outlining the lines of  excision with the patient in a standing position.  The patient was taken to  the operating room where she was given general endotracheal anesthesia.  Her  abdomen was prepped with Betadine and draped with sterile drapes.  A  circular incision was made around the umbilicus and the umbilical stalk was  dissected away from the abdominal flap, leaving a healthy cuff of fat for  vascular  integrity.  The lower skin incision and deepened into a region in  the anterior abdominal wall fascia.  The abdominal flap was elevated from  the suprapubic area up to approximately the umbilicus.  The hernia sac was  visualized and carefully dissected off of the abdominal flap.  At the level  of the umbilicus, the abdominal flap was split in the midline to facilitate  dissection.  The flap dissection continued up to the costal margins  bilaterally and the xiphoid in the midline.  Hemostasis was meticulously  maintained throughout the procedure.  At this point, repair of the hernia  was turned over the Dr. Jamey Ripa.  Upon completion of the hernia repair, the  wound was irrigated with saline irrigation and hemostasis was meticulously  maintained.  The anterior rectus fascia was plicated by uniting the medial  portion of the left and right rectus fascia to each other in order to  tighten the anterior abdominal wall.  In the lower abdomen where the ventral  hernia was, Dr. Jamey Ripa had placed a thin strip of mesh  over the hernia  repair.  After I imbricated the rectus fascia, Dr. Jamey Ripa put a second layer  as an onlay graft of Marlex mesh over the plication of the rectus fascia.  The abdominal wound was again copiously irrigated with saline irrigation.  A  10 mm Blake drain was placed in the wound and brought out through a separate  stab incision.  An On-Q pain pump was placed with two catheters in the  abdomen.  The patient's back was elevated to 30 degrees and the knees  flexed.  The excess skin was pulled in an inferior direction and marked.  All excess skin was removed.  The skin was temporarily stapled in position  and a new location was chosen for the umbilicus.  The umbilical incision was  made and the umbilicus was brought through this new opening in the abdominal  and fixed in position with multiple interrupted buried 3-0 Monocryl sutures  for the dermis.  The abdominal wound was closed by  approximating the dermis  with multiple interrupted 2-0 Vicryl sutures, alternating with 3-0 Monocryl  sutures for the dermis, followed by a running 3-0 Monocryl subcuticular.  The patient tolerated the procedure well with estimated blood loss of less  than 100 cc.  The patient's abdomen was cleansed, dried and Steri-Strips  were applied.  A bulky dressing was placed over the abdominal incision  followed by an abdominal binder.  Estimated blood loss was 100 cc.  The  patient was awakened, extubated and transported to the recovery room in  satisfactory condition.       WBB/MEDQ  D:  12/16/2003  T:  12/16/2003  Job:  [id number]

## 2010-07-03 NOTE — Discharge Summary (Signed)
NAMEALEJANDRA, HUNT                          ACCOUNT NO.:  [id number]   MEDICAL RECORD NO.:  [id number]                   PATIENT TYPE:  INP   LOCATION:  9307                                 FACILITY:  [facility]   PHYSICIAN:  Almedia Balls. Fore, [name]                DATE OF BIRTH:  [birth year]   DATE OF ADMISSION:  02/20/2003  DATE OF DISCHARGE:                                 DISCHARGE SUMMARY   HISTORY:  The patient is a 60 year old with abnormal uterine bleeding,  pelvic pain, and uterine enlargement for a hysterectomy on February 20, 2003.  The remainder of her history and physical are as previously dictated.   LABORATORY DATA:  Preoperative hemoglobin 13.2.  BMET panel normal except  for potassium slightly low at 3.3, sodium slightly low at 134, glucose  slightly elevated at 116.  Chest x-ray showed questionable pleural effusion  bilaterally versus bilateral pleural reaction.  The EKG was normal.   HOSPITAL COURSE:  The patient was taken to the operating room on February 20, 2003 at which time abdominal supracervical hysterectomy was performed.  Because of the bed shortage at [facility] she was transferred to [facility].  She continued her recovery at [facility].  Diet and  ambulation were progressed over the evening of January 6 and morning of  January 7.  She had experienced severe nausea on January 6 and was felt that  she needed to remain at least one more day in the hospital.  On the morning  of January 7 she was afebrile and experiencing no problems except for pain  which was relieved by oral analgesics.  It was felt that she could be  discharged at this time.   FINAL DIAGNOSIS:  1. Abnormal uterine bleeding.  2. Pelvic pain.  3. Uterine enlargement.   OPERATION:  Abdominal supracervical hysterectomy.  Pathology report  unavailable at the time of dictation.   DISPOSITION:  Discharged home to return to the office in 2 weeks for follow-  up.  She was instructed to  gradually progress her activities over several  weeks at home and to limit lifting and driving for 2 weeks.  She was fully  ambulatory, on a regular diet, and in good condition at the time of  discharge.  She was given a prescriptions for:  1. Mepergan Fortis #30 to be taken one or two q.6h. p.r.n. pain.  2. Doxycycline 100 mg #12 to be taken one b.i.d.   She will call for any problems.                                               Almedia Balls. Randell Patient, [name]    SRF/MEDQ  D:  02/22/2003  T:  02/22/2003  Job:  [id number]

## 2010-07-03 NOTE — Op Note (Signed)
NAMEMISHAWN, Katherine Pugh                          ACCOUNT NO.:  [id number]   MEDICAL RECORD NO.:  [id number]                   PATIENT TYPE:  AMB   LOCATION:  DAY                                  FACILITY:  [facility]   PHYSICIAN:  Almedia Balls. Fore, [name]                DATE OF BIRTH:  [birth year]   DATE OF PROCEDURE:  02/20/2003  DATE OF DISCHARGE:                                 OPERATIVE REPORT   PREOPERATIVE DIAGNOSES:  1. Abnormal uterine bleeding.  2. Pelvic pain.  3. Uterine enlargement.   POSTOPERATIVE DIAGNOSES:  1. Abnormal uterine bleeding.  2. Pelvic pain.  3. Uterine enlargement.  4. Pending pathology.   OPERATION:  Abdominal supracervical hysterectomy.   ANESTHESIA:  General orotracheal anesthesia.   OPERATOR:  Almedia Balls. Randell Patient, [name]   ASSISTANT:  Leona Singleton, [name]   INDICATION FOR SURGERY:  The patient is a 61 year old with the above-noted  problems who was counseled as to the need for surgery and the type of  surgery to be performed.  She was fully counseled as to the nature of the  procedure and the risks involved to include risks of anesthesia, injury to  bowel, bladder, blood vessels, ureters, postoperative hemorrhage, infection,  recuperation, possible hormone replacement should her ovaries be removed.  She fully understands all these considerations and has signed informed  consent to proceed on February 20, 2003.   OPERATIVE FINDINGS:  Entry in through the abdominal wall revealed dense  scarring secondary to previous cesarean sections.  The uterus was  midposterior, and the bladder was quite advanced over the lower uterine  segment with dense scarring in this area as well.  Palpation of the upper  abdominal viscera to include lower liver edge-gallbladder area, spleen,  kidneys, periaortic areas, were normal to palpation and/or visualization, as  was the appendiceal area.  The ovaries appeared normal.  The left had  several corpus luteum areas present.   DESCRIPTION OF PROCEDURE:  With the patient under general anesthesia,  prepared and draped in the usual sterile fashion, with a Foley catheter in  the bladder, a lower abdominal transverse incision was made after excising a  previous surgical scar.  This was carried into the peritoneal cavity without  difficulty.  A self-retaining retractor was placed, and the bowel was packed  off.  Kelly clamps were used to clamp the uterine-ovarian anastomoses, tubes  and round ligaments bilaterally for traction and hemostasis.  The round  ligaments were divided using Bovie electrocoagulation with entry into the  retroperitoneal space and development of a bladder flap anteriorly.  This  was accomplished very carefully because of the dense nature of the adhesions  and scarring in this area.  The bladder was advanced over the lower uterine  segment and cervix without difficulty.  Because of the normalcy of the  ovaries and the patient's desire to retain them, Heaney clamps were  placed  proximal to the ovaries bilaterally across the uterine-ovarian anastomoses  and tubes.  These structures were then cut free and doubly ligated with 1  chromic catgut with conservation of both ovaries.  A bleeder in the right  meso-ovarian area was rendered hemostatic with interrupted suture of 2-0  chromic catgut.  The uterine vessels were skeletonized, clamped, cut, and  suture ligated with 1 chromic catgut.  Straight Heaney clamp was used to  clamp the cardinal ligaments bilaterally, which were then cut free and  suture ligated with 1 chromic catgut.  It was then possible to remove the  uterine fundus using Bovie electrocoagulation to core out the endocervix.  The cervical stump was reapproximated and rendered hemostatic with  interrupted figure-of-eight sutures of 1 chromic catgut.  The area was  lavaged with copious amounts of lactated Ringer's solution and after noting  that hemostasis was maintained in the surgical  area, the ovaries were  suspended from the round ligaments bilaterally with interrupted sutures of 1  chromic catgut.  After noting that hemostasis was maintained and that sponge  and instrument counts were correct, the peritoneum was closed with a  continuous suture of 0 Vicryl.  The fascia was closed with two sutures of 0  Vicryl, which were brought from the lateral aspects of the incision and tied  separately in the midline.  Subcutaneous fat was reapproximated with  interrupted sutures of 0 Vicryl.  The skin was closed with a subcuticular  suture of 3-0 plain catgut.  Estimated blood loss 250 mL.  The patient was  taken to the recovery room in good condition following catheterization with  clear urine in the Foley catheter tubing.  She will be placed on 23-hour  observation following surgery.                                               Almedia Balls. Randell Patient, [name]    SRF/MEDQ  D:  02/20/2003  T:  02/20/2003  Job:  [id number]   cc:   Leona Singleton, [name]  [address]  [place]  [place] [address]  Fax: [phone]

## 2010-12-08 ENCOUNTER — Encounter: Payer: 59 | Admitting: Internal Medicine

## 2011-01-04 ENCOUNTER — Encounter: Payer: Self-pay | Admitting: *Deleted

## 2011-01-05 ENCOUNTER — Encounter: Payer: Self-pay | Admitting: Internal Medicine

## 2011-01-05 ENCOUNTER — Ambulatory Visit (HOSPITAL_COMMUNITY): Payer: 59 | Attending: Internal Medicine | Admitting: Radiology

## 2011-01-05 ENCOUNTER — Ambulatory Visit (INDEPENDENT_AMBULATORY_CARE_PROVIDER_SITE_OTHER): Payer: 59 | Admitting: Internal Medicine

## 2011-01-05 DIAGNOSIS — I079 Rheumatic tricuspid valve disease, unspecified: Secondary | ICD-10-CM | POA: Insufficient documentation

## 2011-01-05 DIAGNOSIS — R600 Localized edema: Secondary | ICD-10-CM | POA: Insufficient documentation

## 2011-01-05 DIAGNOSIS — R072 Precordial pain: Secondary | ICD-10-CM | POA: Insufficient documentation

## 2011-01-05 DIAGNOSIS — R002 Palpitations: Secondary | ICD-10-CM

## 2011-01-05 DIAGNOSIS — E785 Hyperlipidemia, unspecified: Secondary | ICD-10-CM | POA: Insufficient documentation

## 2011-01-05 DIAGNOSIS — R0789 Other chest pain: Secondary | ICD-10-CM

## 2011-01-05 DIAGNOSIS — R0600 Dyspnea, unspecified: Secondary | ICD-10-CM

## 2011-01-05 DIAGNOSIS — I10 Essential (primary) hypertension: Secondary | ICD-10-CM | POA: Insufficient documentation

## 2011-01-05 DIAGNOSIS — R609 Edema, unspecified: Secondary | ICD-10-CM

## 2011-01-05 DIAGNOSIS — I379 Nonrheumatic pulmonary valve disorder, unspecified: Secondary | ICD-10-CM | POA: Insufficient documentation

## 2011-01-05 HISTORY — DX: Localized edema: R60.0

## 2011-01-05 HISTORY — DX: Palpitations: R00.2

## 2011-01-05 HISTORY — DX: Other chest pain: R07.89

## 2011-01-05 NOTE — Assessment & Plan Note (Signed)
These are not likely cardiac. They're not associated with exertion at all. She does have a history of reflux and possible peptic ulcer disease. I asked her followup with her primary care physician regarding further therapy for probable reflux causes

## 2011-01-05 NOTE — Assessment & Plan Note (Signed)
Mechanisms are not clear. She thinks that they are regular. With a normal electrocardiogram it is unlikely to represent ventricular tachycardia. We'll plan to check an echo to assess left ventricular function. In the event that this is normal, would not undertake further therapy.

## 2011-01-05 NOTE — Progress Notes (Signed)
  HPI  Katherine Pugh is a 60 y.o. female Seen at her request after a hiatus of 10 years for 3 specific complaints. The first is chest pain which occurs often while seated. It is unrelated to exertion she is quite brief. Cardiac risk factors are negative for   She also has chronic left ankle edema which is worse with prolonged standing. It is been going on for many years.    Past Medical History  Diagnosis Date  . Abnormal uterine bleeding   . Hypertension   . Hiatal hernia   . Chronic back pain     Past Surgical History  Procedure Date  . Dilation and curettage of uterus   . Cesarean section     x 2  . Laparoscopic cholecystectomy     Current Outpatient Prescriptions  Medication Sig Dispense Refill  . aspirin 81 MG tablet Take 81 mg by mouth daily.        Marland Kitchen BENICAR HCT 40-25 MG per tablet Take 1 tablet by mouth daily.       . CYMBALTA 60 MG capsule Take 60 mg by mouth daily.       . diazepam (VALIUM) 5 MG tablet Take 5 mg by mouth as needed.       . furosemide (LASIX) 40 MG tablet Take 40 mg by mouth as needed.       . gabapentin (NEURONTIN) 300 MG capsule Take 300 mg by mouth 3 (three) times daily.       Marland Kitchen HYDROcodone-acetaminophen (NORCO) 5-325 MG per tablet Take 1 tablet by mouth every 6 (six) hours as needed.       Marland Kitchen LEXAPRO 10 MG tablet Take 10 mg by mouth daily.       Marland Kitchen loratadine (CLARITIN) 10 MG tablet Take 10 mg by mouth daily.        . Naproxen-Esomeprazole (VIMOVO) 500-20 MG TBEC Take 1 tablet by mouth as needed.          No Known Allergies  Review of Systems negative except from HPI and PMH  Physical Exam Well developed and well nourished in no acute distress HENT normal E scleral and icterus clear Neck Supple JVP flat; carotids brisk and full Clear to ausculation Regular rate and rhythm, no murmurs gallops or rub Soft with active bowel sounds No clubbing cyanosis and edema Alert and oriented, grossly normal motor and sensory function Skin Warm and  Dry  ECG  Assessment and  Plan

## 2011-01-05 NOTE — Patient Instructions (Signed)
Your physician has requested that you have an echocardiogram. Echocardiography is a painless test that uses sound waves to create images of your heart. It provides your doctor with information about the size and shape of your heart and how well your heart's chambers and valves are working. This procedure takes approximately one hour. There are no restrictions for this procedure.  Your physician has recommended you make the following change in your medication:  1) Stop Aspirin.  Your physician wants you to follow-up in: 1 year with Dr. Graciela Husbands. You will receive a reminder letter in the mail two months in advance. If you don't receive a letter, please call our office to schedule the follow-up appointment.

## 2011-01-05 NOTE — Assessment & Plan Note (Signed)
This is a going on for many years; I don't think it's worth pursuing DVT evaluation

## 2011-04-22 ENCOUNTER — Telehealth: Payer: Self-pay | Admitting: Internal Medicine

## 2011-04-22 NOTE — Telephone Encounter (Signed)
Please return call to patient [phone]  Patient  Blood pressure elevated w/periods of dizziness. Please return call at hm# [phone]

## 2011-04-22 NOTE — Telephone Encounter (Signed)
I spoke with the patient. She reports an elevated BP since the end of last week. She states that adjustments in the medications have been made. She was on Cymbalta 60 mg daily per her rheumatologist, but then it was decreased to a 1/2 tablet by Dr. Vincente Poli due to the high dose she was on and significant weight gain. She followed up with Dr. Dareen Piano (rheumatology) about a week ago and he d/c'ed her Cymbalta altogether. He stated the patient was on a low enough dose that she could d/c abruptly. On Saturday she felt very dizzy and had a headache. She had her BP checked on Monday and she was 168/98 and today she is 168/94. She saw Dr. Vincente Poli yesterday and was started on Viibryd 20 mg QD x 4 weeks with plans to decrease to 10 mg QD (in place of the cymbalta). She is also taking benicar/hct 40/25 mg once daily for her BP. She was started on Corticare B vitamin once daily. She will be starting Qsymia 37.5/23 mg to help facilitate her weight loss per Dr. Dareen Piano. Her concern is that her BP is elevated and Dr. Vincente Poli recommended she contact Dr. Graciela Husbands for follow up. She was on the Cymbalta for her fibromyalgia. Her pain has increased coming off of this. I explained to the patient that the combination of pain and anxiety could be causing a spike in her BP. She has hydrocodone at home and will take a dose tonight to see if some of her pain is relieved. She will take her BP later tonight. She also reports some elevation in her heart rate with exercise. She will come in to see Dr. Graciela Husbands on 05/07/11 for a follow up appointment.

## 2011-05-07 ENCOUNTER — Ambulatory Visit (INDEPENDENT_AMBULATORY_CARE_PROVIDER_SITE_OTHER): Payer: 59 | Admitting: Internal Medicine

## 2011-05-07 ENCOUNTER — Encounter: Payer: Self-pay | Admitting: Internal Medicine

## 2011-05-07 VITALS — BP 154/91 | HR 79 | Ht 64.0 in | Wt 199.0 lb

## 2011-05-07 DIAGNOSIS — I10 Essential (primary) hypertension: Secondary | ICD-10-CM | POA: Insufficient documentation

## 2011-05-07 DIAGNOSIS — R0602 Shortness of breath: Secondary | ICD-10-CM

## 2011-05-07 LAB — D-DIMER, QUANTITATIVE: D-Dimer, Quant: 0.3 ug/mL-FEU (ref 0.00–0.48)

## 2011-05-07 NOTE — Patient Instructions (Signed)
Your physician recommends that you have lab work today: d-dimer  Your physician wants you to follow-up in: November 2013 with Dr. Graciela Husbands. You will receive a reminder letter in the mail two months in advance. If you don't receive a letter, please call our office to schedule the follow-up appointment.  Your physician recommends that you continue on your current medications as directed. Please refer to the Current Medication list given to you today.

## 2011-05-07 NOTE — Progress Notes (Signed)
HPI  Katherine Pugh is a 61 y.o. female Seen with a prior history of PVCs. 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  She comes in today because of concerns of her weight and her blood pressure being elevated. She's recently been started on another stimulant to help facilitate weight loss. Despite efforts to diet she has not been able to weight and this is associated with more problems with her joints. Around the same time, her Cymbalta was discontinued because of weight and this was associated with further elevations in her blood pressure as well as nocturnal arousals spell associated with hypertension and palpitations. She's been started on a different antidepressant. There is been some difference of opinion between her physicians as to these medications and doses. Her blood pressure remains elevated. She is to see Dr. Felipa Eth next week, her PCP  Past Medical History  Diagnosis Date  . Abnormal uterine bleeding   . Hypertension   . Hiatal hernia   . Chronic back pain   . PVC's (premature ventricular contractions)     Past Surgical History  Procedure Date  . Dilation and curettage of uterus   . Cesarean section     x 3  . Laparoscopic cholecystectomy   . Back surgery   . Knee surgery     x2    Current Outpatient Prescriptions  Medication Sig Dispense Refill  . aspirin 81 MG tablet Take 81 mg by mouth daily.      Marland Kitchen BENICAR HCT 40-25 MG per tablet Take 1 tablet by mouth daily.       . diazepam (VALIUM) 5 MG tablet Take 5 mg by mouth as needed.       . fluticasone (FLONASE) 50 MCG/ACT nasal spray As needed      . furosemide (LASIX) 40 MG tablet Take 40 mg by mouth as needed.       . gabapentin (NEURONTIN) 300 MG capsule Take 300 mg by mouth 3 (three) times daily. Only if pt needs it      . HYDROcodone-acetaminophen (NORCO) 5-325 MG per tablet Take 1 tablet by mouth every 6 (six) hours as  needed.       . Naproxen-Esomeprazole (VIMOVO) 500-20 MG TBEC Take 1 tablet by mouth as needed.       . NON FORMULARY CORTICARE  _ MIXTURE OF VITAMINS (RX) 2 tabs twice a day      . PATADAY 0.2 % SOLN As needed      . Phentermine-Topiramate (QSYMIA PO) Take by mouth. (TO HELP WITH WEIGHT LOSS)- 1 tab daily      . Vilazodone HCl (VIIBRYD) 20 MG TABS Take by mouth. 1 tab daily-- started 1 week after pt was taken off of cymbalta      . zolpidem (AMBIEN) 10 MG tablet As needed        Not on File  Review of Systems negative except from HPI and PMH  Physical Exam BP 154/91  Pulse 79  Ht 5\' 4"  (1.626 m)  Wt 199 lb (90.266 kg)  BMI 34.16 kg/m2 Well developed and well nourished in no acute distress HENT normal E scleral and icterus clear Neck Supple JVP flat; carotids brisk and full Clear to ausculation Regular rate and rhythm, no murmurs gallops or rub Soft with active bowel sounds No clubbing cyanosis none Edema Alert and oriented, grossly normal motor and sensory function Skin Warm and Dry  The  echocardiogram demonstrates sinus rhythm at 79 Intervals 0.13/80/37 Axis of 34 Minor ST segment depression  Assessment and  Plan A1

## 2011-05-07 NOTE — Assessment & Plan Note (Signed)
She has moderately significantly elevated blood pressure. I told her however, that its relationship to her changing antidepressants suggested by the coincidently nature is not something that I can recall that familiar with and as there are 2 or 3 physicians are involved and management here but I would defer to them.

## 2011-05-12 ENCOUNTER — Encounter: Payer: Self-pay | Admitting: Internal Medicine

## 2011-07-08 ENCOUNTER — Other Ambulatory Visit: Payer: Self-pay | Admitting: Gastroenterology

## 2011-07-13 ENCOUNTER — Ambulatory Visit
Admission: RE | Admit: 2011-07-13 | Discharge: 2011-07-13 | Disposition: A | Discharge status: Home / Self Care | Payer: 59 | Source: Ambulatory Visit | Attending: Gastroenterology | Admitting: Gastroenterology

## 2011-07-13 MED ORDER — IOHEXOL 300 MG/ML  SOLN
100.0000 mL | Freq: Once | INTRAMUSCULAR | Status: AC | PRN
  Administered 2011-07-13: 100 mL via INTRAVENOUS

## 2011-07-14 ENCOUNTER — Other Ambulatory Visit: Payer: Self-pay | Admitting: Gastroenterology

## 2011-07-14 ENCOUNTER — Ambulatory Visit
Admission: RE | Admit: 2011-07-14 | Discharge: 2011-07-14 | Disposition: A | Discharge status: Home / Self Care | Payer: 59 | Source: Ambulatory Visit | Attending: Gastroenterology | Admitting: Gastroenterology

## 2011-07-14 DIAGNOSIS — R911 Solitary pulmonary nodule: Secondary | ICD-10-CM

## 2011-07-14 MED ORDER — IOHEXOL 300 MG/ML  SOLN
75.0000 mL | Freq: Once | INTRAMUSCULAR | Status: AC | PRN
  Administered 2011-07-14: 75 mL via INTRAVENOUS

## 2012-01-26 ENCOUNTER — Other Ambulatory Visit: Payer: Self-pay | Admitting: Specialist

## 2012-01-26 DIAGNOSIS — M25512 Pain in left shoulder: Secondary | ICD-10-CM

## 2012-01-26 DIAGNOSIS — M542 Cervicalgia: Secondary | ICD-10-CM

## 2012-01-26 DIAGNOSIS — M5134 Other intervertebral disc degeneration, thoracic region: Secondary | ICD-10-CM

## 2012-02-12 ENCOUNTER — Other Ambulatory Visit: Payer: 59

## 2012-02-13 ENCOUNTER — Ambulatory Visit
Admission: RE | Admit: 2012-02-13 | Discharge: 2012-02-13 | Disposition: A | Discharge status: Home / Self Care | Payer: 59 | Source: Ambulatory Visit | Attending: Specialist | Admitting: Specialist

## 2012-02-13 DIAGNOSIS — M5134 Other intervertebral disc degeneration, thoracic region: Secondary | ICD-10-CM

## 2012-02-13 DIAGNOSIS — M542 Cervicalgia: Secondary | ICD-10-CM

## 2012-02-13 DIAGNOSIS — M25512 Pain in left shoulder: Secondary | ICD-10-CM

## 2012-02-23 ENCOUNTER — Encounter: Payer: Self-pay | Admitting: Internal Medicine

## 2012-02-23 ENCOUNTER — Ambulatory Visit (INDEPENDENT_AMBULATORY_CARE_PROVIDER_SITE_OTHER): Payer: 59 | Admitting: Internal Medicine

## 2012-02-23 VITALS — BP 144/91 | HR 78 | Ht 64.5 in | Wt 174.8 lb

## 2012-02-23 DIAGNOSIS — I10 Essential (primary) hypertension: Secondary | ICD-10-CM

## 2012-02-23 DIAGNOSIS — R002 Palpitations: Secondary | ICD-10-CM

## 2012-02-23 NOTE — Progress Notes (Signed)
  HPI  Katherine Pugh is a 62 y.o. female Seen with a prior history of PVCs. 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   we saw her last about a year ago because of concerns about her blood pressure. This was occurring contextual lead to changing antidepressants and I told her I would defer to Dr.Avva and her psychiatrist as to what should be done next  She continues to have problems with pain in her shoulder and her neck  Past Medical History  Diagnosis Date  . Abnormal uterine bleeding   . Hypertension   . Hiatal hernia   . Chronic back pain   . PVC's (premature ventricular contractions)     Past Surgical History  Procedure Date  . Dilation and curettage of uterus   . Cesarean section     x 3  . Laparoscopic cholecystectomy   . Back surgery   . Knee surgery     x2    Current Outpatient Prescriptions  Medication Sig Dispense Refill  . aspirin 81 MG tablet Take 81 mg by mouth daily.      Marland Kitchen BENICAR HCT 40-25 MG per tablet Take 1 tablet by mouth daily.       . diazepam (VALIUM) 5 MG tablet Take 5 mg by mouth as needed.       . fluticasone (FLONASE) 50 MCG/ACT nasal spray As needed      . furosemide (LASIX) 40 MG tablet Take 40 mg by mouth as needed.       . gabapentin (NEURONTIN) 300 MG capsule Take 300 mg by mouth 3 (three) times daily. Only if pt needs it      . HYDROcodone-acetaminophen (NORCO) 5-325 MG per tablet Take 1 tablet by mouth every 6 (six) hours as needed.       . Naproxen-Esomeprazole (VIMOVO) 500-20 MG TBEC Take 1 tablet by mouth as needed.       . NON FORMULARY CORTICARE  _ MIXTURE OF VITAMINS (RX) 2 tabs twice a day      . PATADAY 0.2 % SOLN As needed      . Phentermine-Topiramate (QSYMIA PO) Take by mouth. (TO HELP WITH WEIGHT LOSS)- 1 tab daily      . Vilazodone HCl (VIIBRYD) 20 MG TABS Take by mouth. 1 tab daily-- started 1 week after pt was taken off of  cymbalta      . zolpidem (AMBIEN) 10 MG tablet As needed        Not on File  Review of Systems negative except from HPI and PMH  BP 144/91  Pulse 78  Ht 5' 4.5" (1.638 m)  Wt 174 lb 12.8 oz (79.289 kg)  BMI 29.54 kg/m2 Well developed and nourished in no acute distress HENT normal Neck supple with JVP-flat Clear Regular rate and rhythm, no murmurs or gallops Abd-soft with active BS No Clubbing cyanosis edema Skin-warm and dry A & Oriented  Grossly normal sensory and motor function

## 2012-02-23 NOTE — Patient Instructions (Signed)
We will see you back on an as needed basis.  

## 2012-02-23 NOTE — Assessment & Plan Note (Signed)
Stable  Will followup with PCP  Will see as needed

## 2012-04-11 ENCOUNTER — Other Ambulatory Visit: Payer: Self-pay | Admitting: Obstetrics and Gynecology

## 2013-05-08 ENCOUNTER — Other Ambulatory Visit: Payer: Self-pay | Admitting: Obstetrics and Gynecology

## 2013-06-19 ENCOUNTER — Ambulatory Visit (INDEPENDENT_AMBULATORY_CARE_PROVIDER_SITE_OTHER): Payer: 59 | Admitting: Internal Medicine

## 2013-06-19 ENCOUNTER — Encounter: Payer: Self-pay | Admitting: Internal Medicine

## 2013-06-19 VITALS — BP 144/85 | HR 75 | Ht 64.0 in | Wt 176.0 lb

## 2013-06-19 DIAGNOSIS — R002 Palpitations: Secondary | ICD-10-CM

## 2013-06-19 NOTE — Patient Instructions (Addendum)
Your physician recommends that you continue on your current medications as directed. Please refer to the Current Medication list given to you today.  Your physician has requested that you have a lexiscan myoview. For further information please visit [email or web address]. Please follow instruction sheet, as given.  Follow up to be determined after lexiscan myoview testing.
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
## 2013-07-05 ENCOUNTER — Ambulatory Visit (HOSPITAL_COMMUNITY): Payer: 59 | Attending: Cardiovascular Disease | Admitting: Radiology

## 2013-07-05 VITALS — BP 169/86 | Ht 64.0 in | Wt 178.0 lb

## 2013-07-05 DIAGNOSIS — R002 Palpitations: Secondary | ICD-10-CM

## 2013-07-05 DIAGNOSIS — I10 Essential (primary) hypertension: Secondary | ICD-10-CM | POA: Insufficient documentation

## 2013-07-05 DIAGNOSIS — R079 Chest pain, unspecified: Secondary | ICD-10-CM

## 2013-07-05 DIAGNOSIS — R0602 Shortness of breath: Secondary | ICD-10-CM | POA: Insufficient documentation

## 2013-07-05 MED ORDER — REGADENOSON 0.4 MG/5ML IV SOLN
0.4000 mg | Freq: Once | INTRAVENOUS | Status: AC
  Administered 2013-07-05: 0.4 mg via INTRAVENOUS

## 2013-07-05 MED ORDER — TECHNETIUM TC 99M SESTAMIBI GENERIC - CARDIOLITE
33.0000 | Freq: Once | INTRAVENOUS | Status: AC | PRN
  Administered 2013-07-05: 33 via INTRAVENOUS

## 2013-07-05 MED ORDER — TECHNETIUM TC 99M SESTAMIBI GENERIC - CARDIOLITE
11.0000 | Freq: Once | INTRAVENOUS | Status: AC | PRN
  Administered 2013-07-05: 11 via INTRAVENOUS

## 2013-07-05 NOTE — Progress Notes (Signed)
[facility] SITE 3 NUCLEAR MED [address] [place], [place] [address] [phone]    Cardiology Nuclear Med Study  Katherine JohnsDonna M Strohman is a 63 y.o. female     MRN : [id number]     DOB: February 19, 1950  Procedure Date: 07/05/2013  Nuclear Med Background Indication for Stress Test:  Evaluation for Ischemia History:  2010 Echo EF 55-60% Cardiac Risk Factors: Hypertension and Lipids  Symptoms:  Chest Pain and SOB   Nuclear Pre-Procedure Caffeine/Decaff Intake:  None> 12 hrs NPO After: 7:30am   Lungs:  clear O2 Sat: 98% on room air. IV 0.9% NS with Angio Cath:  22g  IV Site: L Antecubital x 1, tolerated well IV Started by:  Irean HongPatsy Edwards, RN  Chest Size (in):  38 Cup Size: DD  Height: 5\' 4"  (1.626 m)  Weight:  178 lb (80.74 kg)  BMI:  Body mass index is 30.54 kg/(m^2). Tech Comments:  Benicar this am.    Nuclear Med Study 1 or 2 day study: 1 day  Stress Test Type:  Lexiscan  Reading MD: N/A  Order Authorizing Provider:  Sherryl MangesSteven Klein, MD  Resting Radionuclide: Technetium 4311m Sestamibi  Resting Radionuclide Dose: 11.0 mCi   Stress Radionuclide:  Technetium 6411m Sestamibi  Stress Radionuclide Dose: 33.0 mCi           Stress Protocol Rest HR: 65 Stress HR: 93  Rest BP: 169/86 Stress BP: 151/77   Exercise Time (min): n/a METS: n/a   Predicted Max HR: 160 bpm % Max HR: 58.12 bpm Rate Pressure Product: [id number]   Dose of Adenosine (mg):  n/a Dose of Lexiscan: 0.4 mg  Dose of Atropine (mg): n/a Dose of Dobutamine: n/a mcg/kg/min (at max HR)  Stress Test Technologist: Bonnita LevanJackie Smith, RN  Nuclear Technologist:  Domenic PoliteStephen Carbone, CNMT     Rest Procedure:  Myocardial perfusion imaging was performed at rest 45 minutes following the intravenous administration of Technetium 711m Sestamibi. Rest ECG: NSR - Normal EKG  Stress Procedure:  The patient received IV Lexiscan 0.4 mg over 15-seconds.  Technetium 3011m Sestamibi injected at 30-seconds.  Quantitative spect images were obtained  after a 45 minute delay. Stress ECG: No significant change from baseline ECG  QPS Raw Data Images:  Normal; no motion artifact; normal heart/lung ratio. Stress Images:  Normal homogeneous uptake in all areas of the myocardium. Rest Images:  Normal homogeneous uptake in all areas of the myocardium. Subtraction (SDS):  No evidence of ischemia. Transient Ischemic Dilatation (Normal <1.22):  1.04 Lung/Heart Ratio (Normal <0.45):  0.35  Quantitative Gated Spect Images QGS EDV:  91 ml QGS ESV:  33 ml  Impression Exercise Capacity:  Lexiscan with no exercise. BP Response:  Normal blood pressure response. Clinical Symptoms:  Mild chest pain/dyspnea. ECG Impression:  No significant ST segment change suggestive of ischemia. Comparison with Prior Nuclear Study: No images to compare  Overall Impression:  Normal stress nuclear study.  LV Ejection Fraction: 64%.  LV Wall Motion:  NL LV Function; NL Wall Motion  Thurmon FairMihai Jontavia Leatherbury, MD, [facility] [phone] office [phone] pager

## 2013-07-30 ENCOUNTER — Other Ambulatory Visit: Payer: Self-pay | Admitting: Gastroenterology

## 2013-08-31 ENCOUNTER — Other Ambulatory Visit: Payer: Self-pay | Admitting: Dermatology

## 2013-11-21 NOTE — Progress Notes (Signed)
Dr. Graciela HusbandsKlein will see patient on an as needed basis.

## 2014-05-20 ENCOUNTER — Other Ambulatory Visit: Payer: Self-pay | Admitting: Obstetrics and Gynecology

## 2014-05-21 LAB — CYTOLOGY - PAP

## 2014-06-17 ENCOUNTER — Other Ambulatory Visit: Payer: Self-pay | Admitting: Specialist

## 2014-06-17 DIAGNOSIS — M5126 Other intervertebral disc displacement, lumbar region: Secondary | ICD-10-CM

## 2016-01-20 ENCOUNTER — Other Ambulatory Visit: Payer: Self-pay | Admitting: Physical Medicine and Rehabilitation

## 2016-01-20 DIAGNOSIS — M50823 Other cervical disc disorders at C6-C7 level: Secondary | ICD-10-CM

## 2016-01-29 ENCOUNTER — Ambulatory Visit
Admission: RE | Admit: 2016-01-29 | Discharge: 2016-01-29 | Disposition: A | Discharge status: Home / Self Care | Payer: Medicare Other | Source: Ambulatory Visit | Attending: Physical Medicine and Rehabilitation | Admitting: Physical Medicine and Rehabilitation

## 2016-01-29 DIAGNOSIS — M50823 Other cervical disc disorders at C6-C7 level: Secondary | ICD-10-CM

## 2016-04-14 ENCOUNTER — Telehealth: Payer: Self-pay

## 2016-04-14 ENCOUNTER — Ambulatory Visit: Payer: Medicare Other | Admitting: Neurology

## 2016-04-14 NOTE — Telephone Encounter (Signed)
Pt no-showed her new pt appt. 

## 2016-04-20 ENCOUNTER — Encounter: Payer: Self-pay | Admitting: Neurology

## 2016-05-11 ENCOUNTER — Encounter (INDEPENDENT_AMBULATORY_CARE_PROVIDER_SITE_OTHER): Payer: Self-pay

## 2016-05-11 ENCOUNTER — Ambulatory Visit (INDEPENDENT_AMBULATORY_CARE_PROVIDER_SITE_OTHER): Payer: Medicare Other | Admitting: Neurology

## 2016-05-11 ENCOUNTER — Encounter: Payer: Self-pay | Admitting: Neurology

## 2016-05-11 VITALS — BP 122/77 | HR 69 | Ht 64.5 in | Wt 185.0 lb

## 2016-05-11 DIAGNOSIS — M542 Cervicalgia: Secondary | ICD-10-CM

## 2016-05-11 DIAGNOSIS — R51 Headache with orthostatic component, not elsewhere classified: Secondary | ICD-10-CM

## 2016-05-11 DIAGNOSIS — R519 Headache, unspecified: Secondary | ICD-10-CM

## 2016-05-11 DIAGNOSIS — M5030 Other cervical disc degeneration, unspecified cervical region: Secondary | ICD-10-CM | POA: Diagnosis not present

## 2016-05-11 NOTE — Progress Notes (Signed)
[facility]    Provider:  Dr Lucia GaskinsAhern Referring Provider: Chilton GreathouseAvva, Ravisankar, MD Primary Care Physician:  Hoyle SauerAVVA,RAVISANKAR R, MD  CC:  Headaches  HPI:  Katherine Pugh is a 66 y.o. female here as a referral from Dr. Felipa EthAvva for Headaches. Been ongoing for a year, never had headaches before this time. She wakes up with them in the middle of the night and they wake her up from sleep. They are so severe and all over feels like a stroke. Her neck would have discomfort. Her BP would be fine when they took it.  Happens up to 2x a week. Always in the middle of the night or very early 5am. The headache is all over and she also has neck pain. It can last 20-30 minutes after sitting in a chair not moving. No light or sound sensitivity or nausea or vomiting. The headache is acute and severe. She has had some left facial lower droop. A lot of neck pain. Numbness and left arm pain. No other focal neurologic deficits, associated symptoms, inciting events or modifiable factors.  Reviewed notes, labs and imaging from outside physicians, which showed:  Medications tried: Cymbalta, Lexapro.   Personally reviewed cervical spine images and agree with the following: C2-3:  Negative  C3-4: Mild disc degeneration. Mild uncinate spurring without significant stenosis.  C4-5: Disc degeneration. Diffuse mild uncinate spurring right greater than left. Mild right foraminal narrowing unchanged from the prior MRI. No cord deformity.  C5-6: Disc degeneration with disc bulging and uncinate spurring. Mild spinal stenosis. Neural foramina adequately patent  C6-7: Disc degeneration with disc bulging and mild uncinate spurring. Mild spinal stenosis. Neural foramina adequately patent  C7-T1:  Mild degenerative change.  IMPRESSION: Chronic cervical spine degenerative changes and spurring. Mild spinal stenosis at C5-6 and C6-7 due to spurring. No change from the prior MRI.  Review of  Systems: Patient complains of symptoms per HPI as well as the following symptoms: headache, numbness, weakness, joint pain, joint swelling, allergies. Pertinent negatives per HPI. All others negative.   Social History   Social History  . Marital status: Married    Spouse name: N/A  . Number of children: 6  . Years of education: 6812   Occupational History  . Unemployed    Social History Main Topics  . Smoking status: Former Smoker    Packs/day: 0.50    Types: Cigarettes    Quit date: 01/04/1974  . Smokeless tobacco: Never Used  . Alcohol use Yes     Comment: occasional (1-3 per wk)  . Drug use: No  . Sexual activity: Not on file   Other Topics Concern  . Not on file   Social History Narrative   Lives at home w/ her husband   Right-handed   Caffeine: 2-3 cups of coffee    Family History  Problem Relation Age of Onset  . Stroke Mother   . Lung cancer Father   . Cancer Sister   . Lung cancer Brother     has another brother with lung cancer as well.    . Breast cancer Cousin   . Colon cancer Other     unspecified uncle    Past Medical History:  Diagnosis Date  . Abnormal uterine bleeding   . Chronic back pain   . Fibromyalgia   . Hiatal hernia   . High cholesterol   . Hypertension   . PVC's (premature ventricular contractions)     Past Surgical History:  Procedure Laterality  Date  . ABDOMINAL HYSTERECTOMY    . BACK SURGERY    . CESAREAN SECTION     x 3  . DILATION AND CURETTAGE OF UTERUS    . HERNIA REPAIR    . KNEE SURGERY Bilateral    x2  . LAPAROSCOPIC CHOLECYSTECTOMY    . THUMB ARTHROSCOPY      Current Outpatient Prescriptions  Medication Sig Dispense Refill  . ADDERALL XR 30 MG 24 hr capsule     . aspirin 81 MG tablet Take 81 mg by mouth daily.    Marland Kitchen BENICAR HCT 40-25 MG per tablet Take 1 tablet by mouth daily.     Marland Kitchen buPROPion (WELLBUTRIN SR) 100 MG 12 hr tablet Take 150 mg by mouth daily.    . diazepam (VALIUM) 5 MG tablet Take 5 mg by  mouth as needed.     . fluticasone (FLONASE) 50 MCG/ACT nasal spray As needed    . HYDROcodone-acetaminophen (NORCO) 5-325 MG per tablet Take 1 tablet by mouth every 6 (six) hours as needed.     . loratadine (CLARITIN) 10 MG tablet Take 10 mg by mouth daily.    . Naproxen-Esomeprazole (VIMOVO) 500-20 MG TBEC Take 1 tablet by mouth as needed.     Marland Kitchen PATADAY 0.2 % SOLN As needed    . POTASSIUM BICARBONATE PO Take 20 mEq by mouth 2 (two) times daily.    . TRIAMTERENE-HCTZ PO Take by mouth as needed. 50-75 mg as needed    . valACYclovir (VALTREX) 500 MG tablet Take 500 mg by mouth 2 (two) times daily.    Marland Kitchen zolpidem (AMBIEN) 10 MG tablet As needed     No current facility-administered medications for this visit.     Allergies as of 05/11/2016  . (No Known Allergies)    Vitals: BP 122/77   Pulse 69   Ht 5' 4.5" (1.638 m)   Wt 185 lb (83.9 kg)   BMI 31.26 kg/m  Last Weight:  Wt Readings from Last 1 Encounters:  05/11/16 185 lb (83.9 kg)   Last Height:   Ht Readings from Last 1 Encounters:  05/11/16 5' 4.5" (1.638 m)   Physical exam: Exam: Gen: NAD, conversant, well nourised, obese, well groomed                     CV: RRR, no MRG. No Carotid Bruits. No peripheral edema, warm, nontender Eyes: Conjunctivae clear without exudates or hemorrhage  Neuro: Detailed Neurologic Exam  Speech:    Speech is normal; fluent and spontaneous with normal comprehension.  Cognition:    The patient is oriented to person, place, and time;     recent and remote memory intact;     language fluent;     normal attention, concentration,     fund of knowledge Cranial Nerves:    The pupils are equal, round, and reactive to light. The fundi are normal and spontaneous venous pulsations are present. Visual fields are full to finger confrontation. Extraocular movements are intact. Trigeminal sensation is intact and the muscles of mastication are normal. The face is symmetric. The palate elevates in the  midline. Hearing intact. Voice is normal. Shoulder shrug is normal. The tongue has normal motion without fasciculations.   Coordination:    Normal finger to nose and heel to shin. Normal rapid alternating movements.   Gait:    antalgic  Motor Observation:    No asymmetry, no atrophy, and no involuntary movements noted. Tone:  Normal muscle tone.    Posture:    Posture is normal. normal erect    Strength:    Strength is V/V in the upper and lower limbs.      Sensation: intact to LT     Reflex Exam:  DTR's:    Deep tendon reflexes in the upper and lower extremities are normal bilaterally.   Toes:    The toes are downgoing bilaterally.   Clonus:    Clonus is absent.    Assessment/Plan:  Very lovely 66 year old female with new onset headaches after the age of 66, waking her up at night and in the morning, worse with laying down positional.  Musculoskeletal neck pain, mild degenerative cervical disease, cervicalgia: Physical therapy and dry needling Morning and nocturnal headaches severe: Sleep eval with Dr. Vickey Huger New onset headache after the age of 63, positional headache(worse laying down), nocturnal headache: Need MRI brain to evaluate for space occupying lesion or other intracranial etiology, Significant FHx cancer  Discussed: To prevent or relieve headaches, try the following: Cool Compress. Lie down and place a cool compress on your head.  Avoid headache triggers. If certain foods or odors seem to have triggered your migraines in the past, avoid them. A headache diary might help you identify triggers.  Include physical activity in your daily routine. Try a daily walk or other moderate aerobic exercise.  Manage stress. Find healthy ways to cope with the stressors, such as delegating tasks on your to-do list.  Practice relaxation techniques. Try deep breathing, yoga, massage and visualization.  Eat regularly. Eating regularly scheduled meals and maintaining a healthy  diet might help prevent headaches. Also, drink plenty of fluids.  Follow a regular sleep schedule. Sleep deprivation might contribute to headaches Consider biofeedback. With this mind-body technique, you learn to control certain bodily functions - such as muscle tension, heart rate and blood pressure - to prevent headaches or reduce headache pain.    Proceed to emergency room if you experience new or worsening symptoms or symptoms do not resolve, if you have new neurologic symptoms or if headache is severe, or for any concerning symptom.   Orders Placed This Encounter  Procedures  . MR BRAIN W WO CONTRAST  . Basic Metabolic Panel  . Ambulatory referral to Physical Therapy  . Ambulatory referral to Sleep Studies    Naomie Dean, MD  [facility] [address] [place], [place] [address]  Phone [phone] Fax [phone]

## 2016-05-11 NOTE — Patient Instructions (Signed)
Remember to drink plenty of fluid, eat healthy meals and do not skip any meals. Try to eat protein with a every meal and eat a healthy snack such as fruit or nuts in between meals. Try to keep a regular sleep-wake schedule and try to exercise daily, particularly in the form of walking, 20-30 minutes a day, if you can.   As far as diagnostic testing: Physical therapy and dry needling, Sleep evaluation and MRI brainm  I would like to see you back in 3 months, sooner if we need to. Please call us with any interim questions, concerns, problems, updates or refill requests.   Our phone number is [phone]. We also have an after hours call service for urgent matters and there is a physician on-call for urgent questions. For any emergencies you know to call 911 or go to the nearest emergency room   Sleep Apnea Sleep apnea is a condition in which breathing pauses or becomes shallow during sleep. Episodes of sleep apnea usually last 10 seconds or longer, and they may occur as many as 20 times an hour. Sleep apnea disrupts your sleep and keeps your body from getting the rest that it needs. This condition can increase your risk of certain health problems, including:  Heart attack.  Stroke.  Obesity.  Diabetes.  Heart failure.  Irregular heartbeat. There are three kinds of sleep apnea:  Obstructive sleep apnea. This kind is caused by a blocked or collapsed airway.  Central sleep apnea. This kind happens when the part of the brain that controls breathing does not send the correct signals to the muscles that control breathing.  Mixed sleep apnea. This is a combination of obstructive and central sleep apnea. What are the causes? The most common cause of this condition is a collapsed or blocked airway. An airway can collapse or become blocked if:  Your throat muscles are abnormally relaxed.  Your tongue and tonsils are larger than normal.  You are overweight.  Your airway is smaller than  normal. What increases the risk? This condition is more likely to develop in people who:  Are overweight.  Smoke.  Have a smaller than normal airway.  Are elderly.  Are female.  Drink alcohol.  Take sedatives or tranquilizers.  Have a family history of sleep apnea. What are the signs or symptoms? Symptoms of this condition include:  Trouble staying asleep.  Daytime sleepiness and tiredness.  Irritability.  Loud snoring.  Morning headaches.  Trouble concentrating.  Forgetfulness.  Decreased interest in sex.  Unexplained sleepiness.  Mood swings.  Personality changes.  Feelings of depression.  Waking up often during the night to urinate.  Dry mouth.  Sore throat. How is this diagnosed? This condition may be diagnosed with:  A medical history.  A physical exam.  A series of tests that are done while you are sleeping (sleep study). These tests are usually done in a sleep lab, but they may also be done at home. How is this treated? Treatment for this condition aims to restore normal breathing and to ease symptoms during sleep. It may involve managing health issues that can affect breathing, such as high blood pressure or obesity. Treatment may include:  Sleeping on your side.  Using a decongestant if you have nasal congestion.  Avoiding the use of depressants, including alcohol, sedatives, and narcotics.  Losing weight if you are overweight.  Making changes to your diet.  Quitting smoking.  Using a device to open your airway while you sleep,  such as:  An oral appliance. This is a custom-made mouthpiece that shifts your lower jaw forward.  A continuous positive airway pressure (CPAP) device. This device delivers oxygen to your airway through a mask.  A nasal expiratory positive airway pressure (EPAP) device. This device has valves that you put into each nostril.  A bi-level positive airway pressure (BPAP) device. This device delivers oxygen to  your airway through a mask.  Surgery if other treatments do not work. During surgery, excess tissue is removed to create a wider airway. It is important to get treatment for sleep apnea. Without treatment, this condition can lead to:  High blood pressure.  Coronary artery disease.  (Men) An inability to achieve or maintain an erection (impotence).  Reduced thinking abilities. Follow these instructions at home:  Make any lifestyle changes that your health care provider recommends.  Eat a healthy, well-balanced diet.  Take over-the-counter and prescription medicines only as told by your health care provider.  Avoid using depressants, including alcohol, sedatives, and narcotics.  Take steps to lose weight if you are overweight.  If you were given a device to open your airway while you sleep, use it only as told by your health care provider.  Do not use any tobacco products, such as cigarettes, chewing tobacco, and e-cigarettes. If you need help quitting, ask your health care provider.  Keep all follow-up visits as told by your health care provider. This is important. Contact a health care provider if:  The device that you received to open your airway during sleep is uncomfortable or does not seem to be working.  Your symptoms do not improve.  Your symptoms get worse. Get help right away if:  You develop chest pain.  You develop shortness of breath.  You develop discomfort in your back, arms, or stomach.  You have trouble speaking.  You have weakness on one side of your body.  You have drooping in your face. These symptoms may represent a serious problem that is an emergency. Do not wait to see if the symptoms will go away. Get medical help right away. Call your local emergency services (911 in the [place] Do not drive yourself to the hospital. This information is not intended to replace advice given to you by your health care provider. Make sure you discuss any questions  you have with your health care provider. Document Released: 01/22/2002 Document Revised: 09/28/2015 Document Reviewed: 11/11/2014 Elsevier Interactive Patient Education  2017 [employer].

## 2016-05-12 LAB — BASIC METABOLIC PANEL
BUN/Creatinine Ratio: 22 (ref 12–28)
BUN: 21 mg/dL (ref 8–27)
CO2: 27 mmol/L (ref 18–29)
Calcium: 9.5 mg/dL (ref 8.7–10.3)
Chloride: 98 mmol/L (ref 96–106)
Creatinine, Ser: 0.94 mg/dL (ref 0.57–1.00)
GFR calc Af Amer: 75 mL/min/{1.73_m2} (ref >59)
GFR calc non Af Amer: 65 mL/min/{1.73_m2} (ref >59)
Glucose: 98 mg/dL (ref 65–99)
Potassium: 4.6 mmol/L (ref 3.5–5.2)
Sodium: 140 mmol/L (ref 134–144)

## 2016-05-13 ENCOUNTER — Telehealth: Payer: Self-pay

## 2016-05-13 NOTE — Telephone Encounter (Signed)
-----   Message from Anson FretAntonia B Ahern, MD sent at 05/12/2016  9:48 AM EDT ----- Labs normal thanks

## 2016-05-13 NOTE — Telephone Encounter (Signed)
Called pt w/ normal lab results. May call back w/ additional questions/concerns. 

## 2016-05-27 ENCOUNTER — Ambulatory Visit
Admission: RE | Admit: 2016-05-27 | Discharge: 2016-05-27 | Disposition: A | Discharge status: Home / Self Care | Payer: Medicare Other | Source: Ambulatory Visit | Attending: Neurology | Admitting: Neurology

## 2016-05-27 DIAGNOSIS — R51 Headache with orthostatic component, not elsewhere classified: Secondary | ICD-10-CM

## 2016-05-27 DIAGNOSIS — R519 Headache, unspecified: Secondary | ICD-10-CM

## 2016-05-27 MED ORDER — GADOBENATE DIMEGLUMINE 529 MG/ML IV SOLN
15.0000 mL | Freq: Once | INTRAVENOUS | Status: AC | PRN
  Administered 2016-05-27: 15 mL via INTRAVENOUS

## 2016-05-31 ENCOUNTER — Other Ambulatory Visit: Payer: Self-pay | Admitting: Neurology

## 2016-05-31 DIAGNOSIS — R519 Headache, unspecified: Secondary | ICD-10-CM

## 2016-05-31 DIAGNOSIS — R51 Headache with orthostatic component, not elsewhere classified: Secondary | ICD-10-CM

## 2016-06-01 ENCOUNTER — Telehealth: Payer: Self-pay

## 2016-06-01 NOTE — Telephone Encounter (Signed)
Called pt w/ unremarkable MRI results. Verbalized understanding and appreciation for call. Reports that she starts PT tomorrow for neck pain and will call the office if she does not hear back on scheduling sleep eval over the next several days.

## 2016-06-01 NOTE — Telephone Encounter (Signed)
-----   Message from Anson Fret, MD sent at 05/31/2016  1:50 PM EDT ----- MRI of the brain is unremarkable, no reason for her headaches. I will review in detail and go through the images at next appointment with her.

## 2016-06-07 ENCOUNTER — Ambulatory Visit: Payer: Medicare Other | Attending: Neurology | Admitting: Rehabilitative and Restorative Service Providers"

## 2016-06-07 DIAGNOSIS — M6281 Muscle weakness (generalized): Secondary | ICD-10-CM | POA: Diagnosis present

## 2016-06-07 DIAGNOSIS — M542 Cervicalgia: Secondary | ICD-10-CM | POA: Diagnosis present

## 2016-06-07 DIAGNOSIS — R293 Abnormal posture: Secondary | ICD-10-CM | POA: Diagnosis present

## 2016-06-07 NOTE — Patient Instructions (Signed)
Thoracic Self-Mobilization (Supine)    With rolled towel placed lengthwise between shoulder blades lie back on towel with arms outstretched. Hold _2 minutes. Relax. Repeat __1-2__ times per day as needed for postural stretching. *can use 2 pillows for neck comfort *may need to do on the floor for a more solid support surface http://orth.exer.us/1001   Copyright  VHI. All rights reserved.   Levator Scapula Stretch, Sitting    Sit, one hand tucked under hip on side to be stretched, other hand over top of head. Turn head toward other side and look down. Use hand on head to gently stretch neck in that position. Hold _20__ seconds. Repeat _3__ times per session. Do _2__ sessions per day.  Copyright  VHI. All rights reserved.    Extensors, Supine    Lie supine, head on small, rolled towel or one pillow.  Gently tuck chin and bring toward chest. Hold __5_ seconds. Repeat __5-8  times per session. Do _2__ sessions per day.  *work up to 10-15 times as you get stronger  Copyright  VHI. All rights reserved.   Scapular Retraction (Prone)    Lie with arms at sides. Pinch shoulder blades together and raise arms a few inches from floor.  Then reach down towards your shoes while arms lifted.  Hold 3-5 seconds.  Repeat _10___ times per set. Do __2__ sets per session. Do _2___ sessions per day.  http://orth.exer.us/955   Copyright  VHI. All rights reserved.     Scapular Retraction: Abduction (Prone)  Lie with upper arms straight out from sides, elbows bent to 90. Pinch shoulder blades together and raise arms a few inches from floor. Repeat  5 times per set. Do ___2_ sets per session. Do __2__ sessions per day.  *work up to 10 as you are able to tolerate.

## 2016-06-08 NOTE — Therapy (Signed)
[facility] [address] [place], [place], [address] Phone: [phone]   Fax:  [phone]  Physical Therapy Evaluation  Patient Details  Name: Katherine Pugh MRN: [id number] Date of Birth: 1951-02-02 Referring Provider: Anda Kraft, MD  Encounter Date: 06/07/2016      PT End of Session - 06/08/16 1221    Visit Number 1   Number of Visits 8   Date for PT Re-Evaluation 08/07/16   Authorization Type G code every 10th visit   PT Start Time 0850   PT Stop Time 0930   PT Time Calculation (min) 40 min   Activity Tolerance Patient tolerated treatment well   Behavior During Therapy [facility] for tasks assessed/performed      Past Medical History:  Diagnosis Date  . Abnormal uterine bleeding   . Chronic back pain   . Fibromyalgia   . Hiatal hernia   . High cholesterol   . Hypertension   . PVC's (premature ventricular contractions)     Past Surgical History:  Procedure Laterality Date  . ABDOMINAL HYSTERECTOMY    . BACK SURGERY    . CESAREAN SECTION     x 3  . DILATION AND CURETTAGE OF UTERUS    . HERNIA REPAIR    . KNEE SURGERY Bilateral    x2  . LAPAROSCOPIC CHOLECYSTECTOMY    . THUMB ARTHROSCOPY      There were no vitals filed for this visit.       Subjective Assessment - 06/07/16 0850    Subjective The patient reports h/o chronic neck pain and reports she struggles to look down at a book or up at a screen.  She describes severe HA in the middle of the night that wake her x 8 months.  She also has pressure in her head feeling like someone pressing down through her head.  She switched pillows to a contoured pillow.  During these episodes, she feels that she can't walk  because her head feels like she it going to explode and her neck is stiff.  The patient had an MRI and plans to have a sleep study (scheduled in June).     Pertinent History h/o back surgery, fibromyalgia, PVCs, HTN.    Patient Stated Goals  relieve pressure and ensure neck exercises are going well.    Provide everyday relief.   Currently in Pain? Yes   Pain Score --  "describes uncomfortable feeling"   Pain Location Neck  and head   Pain Orientation Upper   Pain Descriptors / Indicators Pressure   Pain Type Chronic pain   Pain Onset More than a month ago   Pain Frequency Intermittent   Aggravating Factors  worse at night   Pain Relieving Factors takes time to stop; nothing improves            [facility] PT Assessment - 06/07/16 0846      Assessment   Medical Diagnosis neck pain, cervicalgia   Referring Provider Anda Kraft, MD   Onset Date/Surgical Date --  2017   Hand Dominance Right   Prior Therapy [facility] years ago     Precautions   Precautions None     Restrictions   Weight Bearing Restrictions No     Balance Screen   Has the patient fallen in the past 6 months No   Has the patient had a decrease in activity level because of a fear of falling?  No   Is the patient  reluctant to leave their home because of a fear of falling?  No     Home [employer] residence   Living Arrangements Spouse/significant other     Prior Function   Level of Independence Independent   Leisure helps take care of grandkids;  avoids lifting heavy things since back surgery     Cognition   Overall Cognitive Status Within Functional Limits for tasks assessed     Sensation   Light Touch Appears Intact  does note some h/o numbness      Posture/Postural Control   Posture/Postural Control Postural limitations:  dec'd thoracic mobility, tightness in anterior chest musculature, mild rounding of shoulders     ROM / Strength   AROM / PROM / Strength AROM;Strength     AROM   AROM Assessment Site Cervical;Shoulder   Right/Left Shoulder Right;Left  bilateral shoulders WFLs   Cervical Flexion 22   Cervical Extension 20   Cervical - Right Side Bend 25   Cervical - Left Side Bend 20    Cervical - Right Rotation 70   Cervical - Left Rotation 70     Strength   Overall Strength Deficits   Overall Strength Comments bilateral shoulder flexion 4/5, bilateral shoulder abduction 4/5, bilateral elbow flexion/extension 5/5.     Flexibility   Soft Tissue Assessment /Muscle Length --     Palpation   Palpation comment Tightness in bilateral scalenes, suboccipital musculature.                   [facility] Adult PT Treatment/Exercise - 06/08/16 0001      Neck Exercises: Seated   Lateral Flexion Both  levator stretch x 2 reps each     Neck Exercises: Supine   Neck Retraction 10 reps  with towel roll under suboccipital region.   Other Supine Exercise self mobilization thoracic spine     Neck Exercises: Prone   W Back 10 reps   Shoulder Extension 10 reps   Shoulder Extension Limitations with cues to reach to toes for depression                PT Education - 06/08/16 1220    Education provided Yes   Education Details HEP: see printout for postural stretching and stabilization   Person(s) Educated Patient   Methods Explanation;Demonstration;Handout   Comprehension Verbalized understanding;Returned demonstration             PT Long Term Goals - 06/08/16 1222      PT LONG TERM GOAL #1   Title The patient will be indep with HEP for postural stretching and stabilization.   Baseline Target date 08/07/2016 (patient will be out of town x 30 days due to helping daughter in [place] with baby)   Time 4   Period Weeks     PT LONG TERM GOAL #2   Title The patient will improve bilateral shoulder flexion/abduction strength to 5/5 (from baseline of 4/5)   Baseline Target date 08/07/16   Time 4   Period Weeks     PT LONG TERM GOAL #3   Title The patient will report episodes of night waking from pain to < or equal to 1x over 2 week period (with cues on positioning)   Baseline Target date 08/07/16   Time 4   Period Weeks     PT LONG TERM GOAL #4   Title The  patient will increase cervical flexion from 20 degrees to > or equal to  30 degrees.    Baseline Target date 08/07/16   Time 4   Period Weeks               Plan - 06/08/16 1227    Clinical Impression Statement The patient is a 66 year old female presenting to OP PT with chronic h/o neck and back pain.  She has had worsening episodes of night waking with neck pain and headache x 8 months.   PT to address cervical tightness, postural weakness, and progress strengthening activities to improve functional mobility.   Rehab Potential Good   PT Frequency 2x / week   PT Duration 4 weeks   PT Treatment/Interventions ADLs/Self Care Home Management;Therapeutic activities;Therapeutic exercise;Cryotherapy;Functional mobility training;Patient/family education;Neuromuscular re-education;Manual techniques   PT Next Visit Plan Check HEP, work on postural stretching and stabilization activities.   Consulted and Agree with Plan of Care Patient      Patient will benefit from skilled therapeutic intervention in order to improve the following deficits and impairments:  Decreased range of motion, Pain, Hypomobility, Impaired flexibility, Improper body mechanics, Postural dysfunction, Decreased strength  Visit Diagnosis: Neck pain  Abnormal posture  Muscle weakness (generalized)     Problem List Patient Active Problem List   Diagnosis Date Noted  . Hypertension 05/07/2011  . Atypical chest pain 01/05/2011  . Leg edema-left 01/05/2011  . Palpitations-nocturnal 01/05/2011    Katherine Pugh, PT 06/08/2016, 12:30 PM  [facility] [address] [place], [place], [address] Phone: [phone]   Fax:  [phone]  Name: Katherine Pugh MRN: [id number] Date of Birth: February 08, 1951

## 2016-06-09 ENCOUNTER — Ambulatory Visit: Payer: Medicare Other | Admitting: Rehabilitative and Restorative Service Providers"

## 2016-06-09 DIAGNOSIS — M6281 Muscle weakness (generalized): Secondary | ICD-10-CM

## 2016-06-09 DIAGNOSIS — M542 Cervicalgia: Secondary | ICD-10-CM

## 2016-06-09 DIAGNOSIS — R293 Abnormal posture: Secondary | ICD-10-CM

## 2016-06-09 NOTE — Therapy (Signed)
[facility] [address] [place], [place], [address] Phone: [phone]   Fax:  [phone]  Physical Therapy Treatment  Patient Details  Name: Katherine Pugh MRN: [id number] Date of Birth: 09/28/1950 Referring Provider: Anda Kraft, MD  Encounter Date: 06/09/2016      PT End of Session - 06/09/16 0850    Visit Number 2   Number of Visits 8   Date for PT Re-Evaluation 08/07/16   Authorization Type G code every 10th visit   PT Start Time 0850   PT Stop Time 0930   PT Time Calculation (min) 40 min   Activity Tolerance Patient tolerated treatment well   Behavior During Therapy [facility] for tasks assessed/performed      Past Medical History:  Diagnosis Date  . Abnormal uterine bleeding   . Chronic back pain   . Fibromyalgia   . Hiatal hernia   . High cholesterol   . Hypertension   . PVC's (premature ventricular contractions)     Past Surgical History:  Procedure Laterality Date  . ABDOMINAL HYSTERECTOMY    . BACK SURGERY    . CESAREAN SECTION     x 3  . DILATION AND CURETTAGE OF UTERUS    . HERNIA REPAIR    . KNEE SURGERY Bilateral    x2  . LAPAROSCOPIC CHOLECYSTECTOMY    . THUMB ARTHROSCOPY      There were no vitals filed for this visit.      Subjective Assessment - 06/09/16 0857    Subjective Patient did ther ex--she notes some discomfort in between shoulder blades from prone scapular retraction.    Pertinent History h/o back surgery, fibromyalgia, PVCs, HTN.    Patient Stated Goals relieve pressure and ensure neck exercises are going well.    Provide everyday relief.                         [facility] Adult PT Treatment/Exercise - 06/09/16 0903      Exercises   Exercises Neck;Shoulder     Neck Exercises: Theraband   Shoulder External Rotation 10 reps   Shoulder External Rotation Limitations red theraband seated; also attempted in sidelying with 2 lb weight and theraband.     Neck Exercises: Standing   Other Standing Exercises Attempted standing shoulder flexion with 2 lb weight, however patient elevates shoulder--recommended we begin with prescribed HEP *did not add .  Also tried isometric shoulder flexion and patient elevated shoulder.       Neck Exercises: Supine   Capital Flexion 10 reps;3 secs   Capital Flexion Limitations emphasizing gentle chin tuck before lifting head from pillow.     Neck Exercises: Prone   W Back 5 reps   Upper Extremity Flexion with Stabilization 10 reps   UE Flexion with Stabilization Limitations Added to HEP   Other Prone Exercise Quadriped UE flexion reaching anteriorly x 5 reps right and left sides.  Began with low reps due to thumb injurires.     Shoulder Exercises: Sidelying   External Rotation 10 reps;Right;Left;Strengthening;Weights  2 lbs                PT Education - 06/09/16 0945    Education provided Yes   Education Details HEP: added prone shoulder flexion overhead, sidelying external rotation   Person(s) Educated Patient   Methods Explanation;Demonstration;Handout   Comprehension Verbalized understanding;Returned demonstration  PT Long Term Goals - 06/08/16 1222      PT LONG TERM GOAL #1   Title The patient will be indep with HEP for postural stretching and stabilization.   Baseline Target date 08/07/2016 (patient will be out of town x 30 days due to helping daughter in [place] with baby)   Time 4   Period Weeks     PT LONG TERM GOAL #2   Title The patient will improve bilateral shoulder flexion/abduction strength to 5/5 (from baseline of 4/5)   Baseline Target date 08/07/16   Time 4   Period Weeks     PT LONG TERM GOAL #3   Title The patient will report episodes of night waking from pain to < or equal to 1x over 2 week period (with cues on positioning)   Baseline Target date 08/07/16   Time 4   Period Weeks     PT LONG TERM GOAL #4   Title The patient will increase cervical  flexion from 20 degrees to > or equal to 30 degrees.    Baseline Target date 08/07/16   Time 4   Period Weeks               Plan - 06/09/16 1610    Clinical Impression Statement PT added shoulder stabilization exercises to work on rotators of the shoulder and postural stabilizers.  Also began neck strengthening for improved endurance as patient reported at evaluation that she sometimes has to prop her head up on her hand while driving.  Patient is headed to [place] x weeks to help with new grandchild.  She will work on current HEP and return to therapy once back in town.    PT Treatment/Interventions ADLs/Self Care Home Management;Therapeutic activities;Therapeutic exercise;Cryotherapy;Functional mobility training;Patient/family education;Neuromuscular re-education;Manual techniques   PT Next Visit Plan Check HEP.  Prone shoulder strengthening/scapular stability, thoracic mobility, shoulder strengthening flexion/abduction.  Neck stabilizers.   Consulted and Agree with Plan of Care Patient      Patient will benefit from skilled therapeutic intervention in order to improve the following deficits and impairments:  Decreased range of motion, Pain, Hypomobility, Impaired flexibility, Improper body mechanics, Postural dysfunction, Decreased strength  Visit Diagnosis: Neck pain  Abnormal posture  Muscle weakness (generalized)     Problem List Patient Active Problem List   Diagnosis Date Noted  . Hypertension 05/07/2011  . Atypical chest pain 01/05/2011  . Leg edema-left 01/05/2011  . Palpitations-nocturnal 01/05/2011    Clessie Karras, PT 06/09/2016, 9:58 AM  [facility] [address] [place], [place], [address] Phone: [phone]   Fax:  [phone]  Name: RAYNE COWDREY MRN: [id number] Date of Birth: 04-Jul-1950

## 2016-06-09 NOTE — Patient Instructions (Signed)
Thoracic Self-Mobilization (Supine)    With rolled towel placed lengthwise between shoulder blades lie back on towel with arms outstretched. Hold _2 minutes. Relax. Repeat __1-2__ times per day as needed for postural stretching. *can use 2 pillows for neck comfort *may need to do on the floor for a more solid support surface http://orth.exer.us/1001   Copyright  VHI. All rights reserved.   Levator Scapula Stretch, Sitting    Sit, one hand tucked under hip on side to be stretched, other hand over top of head. Turn head toward other side and look down. Use hand on head to gently stretch neck in that position. Hold _20__ seconds. Repeat _3__ times per session. Do _2__ sessions per day.  Copyright  VHI. All rights reserved.    Extensors, Supine    Lie supine, head on small, rolled towel or one pillow.  Gently tuck chin and bring toward chest. Hold __5_ seconds. Repeat __5-8  times per session. Do _2__ sessions per day.  *work up to 10-15 times as you get stronger  Copyright  VHI. All rights reserved.   Scapular Retraction (Prone)    Lie with arms at sides. Pinch shoulder blades together and raise arms a few inches from floor.  Then reach down towards your shoes while arms lifted.  Hold 3-5 seconds.  Repeat _10___ times per set. Do __2__ sets per session. Do _2___ sessions per day.  http://orth.exer.us/955   Copyright  VHI. All rights reserved.     Scapular Retraction: Abduction (Prone)  Lie with upper arms straight out from sides, elbows bent to 90. Pinch shoulder blades together and raise arms a few inches from floor. Repeat  5 times per set. Do ___2_ sets per session. Do __2__ sessions per day.  *work up to 10 as you are able to tolerate.  EXTERNAL ROTATION: Side-Lying (Active)    Lie on right side, top arm bent to 90, elbow against side, hand forward. Rotate forearm up as high as possible. Can hold a can for weight. Complete __1_ sets of _8-10__  repetitions. Perform _1-2__ sessions per day.  Copyright  VHI. All rights reserved.   Scapular Retraction: Flexion (Prone)    Lie with arms forward. Pinch shoulder blades together and raise arms a few inches from floor. Repeat __8-10__ times per set. Do __2__ sets per session. Do _1-2___ sessions per day.  http://orth.exer.us/961   Copyright  VHI. All rights reserved.

## 2016-07-21 ENCOUNTER — Ambulatory Visit: Payer: Medicare Other | Attending: Neurology | Admitting: Rehabilitative and Restorative Service Providers"

## 2016-07-21 ENCOUNTER — Encounter: Payer: Self-pay | Admitting: Neurology

## 2016-07-21 ENCOUNTER — Ambulatory Visit (INDEPENDENT_AMBULATORY_CARE_PROVIDER_SITE_OTHER): Payer: Medicare Other | Admitting: Neurology

## 2016-07-21 VITALS — BP 134/79 | HR 72 | Resp 20 | Ht 64.0 in | Wt 182.0 lb

## 2016-07-21 DIAGNOSIS — R293 Abnormal posture: Secondary | ICD-10-CM

## 2016-07-21 DIAGNOSIS — M791 Myalgia, unspecified site: Secondary | ICD-10-CM

## 2016-07-21 DIAGNOSIS — M542 Cervicalgia: Secondary | ICD-10-CM | POA: Diagnosis present

## 2016-07-21 DIAGNOSIS — G44019 Episodic cluster headache, not intractable: Secondary | ICD-10-CM | POA: Diagnosis not present

## 2016-07-21 DIAGNOSIS — M6281 Muscle weakness (generalized): Secondary | ICD-10-CM

## 2016-07-21 DIAGNOSIS — J300 Vasomotor rhinitis: Secondary | ICD-10-CM | POA: Diagnosis not present

## 2016-07-21 DIAGNOSIS — R0683 Snoring: Secondary | ICD-10-CM

## 2016-07-21 HISTORY — DX: Myalgia, unspecified site: M79.10

## 2016-07-21 HISTORY — DX: Episodic cluster headache, not intractable: G44.019

## 2016-07-21 HISTORY — DX: Vasomotor rhinitis: J30.0

## 2016-07-21 NOTE — Patient Instructions (Signed)
Co2 monitoring with SPLIT diagnostic part.  Cluster Headache A cluster headache is a type of headache that causes deep, intense head pain. Cluster headaches can last from 15 minutes to 3 hours. They usually occur:  On one side of the head. They may occur on the other side when a new cluster of headaches begins.  Repeatedly over weeks to months.  Several times a day.  At the same time of day, often at night.  More often in the fall and springtime.  What are the causes? The cause of this condition is not known. What increases the risk? This condition is more likely to develop in:  Males.  People who drink alcohol.  People who smoke or use products that contain nicotine or tobacco.  People who take medicines that cause blood vessels to expand, such as nitroglycerin.  People who take antihistamines.  What are the signs or symptoms? Symptoms of this condition include:  Severe pain on one side of the head that begins behind or around your eye or temple.  Pain on one side of the head.  Nausea.  Sensitivity to light.  Runny nose and nasal stuffiness.  Sweaty, pale skin on the face.  Droopy or swollen eyelid, eye redness, or tearing.  Restlessness and agitation.  How is this diagnosed? This condition may be diagnosed based on:  Your symptoms.  A physical exam.  Your health care provider may order tests to see if your headaches are caused by another medical condition. These tests may show that you do not have cluster headaches. Tests may include:  A CT scan of your head.  An MRI of your head.  Lab tests.  How is this treated? This condition may be treated with:  Medicines to relieve pain and to prevent repeated (recurrent) attacks. Some people may need a combination of medicines.  Oxygen. This helps to relieve pain.  Follow these instructions at home: Headache diary Keep a headache diary as told by your health care provider. Doing this can help you and  your health care provider figure out what triggers your headaches. In your headache diary, include information about:  The time of day that your headache started and what you were doing when it began.  How long your headache lasted.  Where your pain started and whether it moved to other areas.  The type of pain, such as burning, stabbing, throbbing, or cramping.  Your level of pain. Use a pain scale and rate the pain with a number from 1 (mild) up to 10 (severe).  The treatment that you used, and any change in symptoms after treatment.  Medicines  Take over-the-counter and prescription medicines only as told by your health care provider.  Do not drive or use heavy machinery while taking prescription pain medicine.  Use oxygen as told by your health care provider. Lifestyle  Follow a regular sleep schedule. Do not vary the time that you go to bed or the amount that you sleep from day to day. It is important to stay on the same schedule during a cluster period to help prevent headaches.  Exercise regularly.  Eat a healthy diet and avoid foods that may trigger your headaches.  Avoid alcohol.  Do not use any products that contain nicotine or tobacco, such as cigarettes and e-cigarettes. If you need help quitting, ask your health care provider. Contact a health care provider if:  Your headaches change, become more severe, or occur more often.  The medicine or oxygen  that your health care provider recommended does not help. Get help right away if:  You faint.  You have weakness or numbness, especially on one side of your body or face.  You have double vision.  You have nausea or vomiting that does not go away within several hours.  You have trouble talking, walking, or keeping your balance.  You have pain or stiffness in your neck.  You have a fever. Summary  A cluster headache is a type of headache that causes deep, intense head pain, usually on one side of the  head.  Keep a headache diary to help discover what triggers your headaches.  A regular sleep schedule can help prevent headaches. This information is not intended to replace advice given to you by your health care provider. Make sure you discuss any questions you have with your health care provider. Document Released: 02/01/2005 Document Revised: 10/14/2015 Document Reviewed: 10/14/2015 Elsevier Interactive Patient Education  [employer]2018 [employer]

## 2016-07-21 NOTE — Progress Notes (Signed)
SLEEP MEDICINE CLINIC   Provider:  Melvyn Novas, M D  Primary Care Physician:  Chilton Greathouse, MD   Referring Provider: Chilton Greathouse, MD    Chief Complaint  Patient presents with  . New Patient (Initial Visit)    snores, had sleep study in the 1990s    HPI:  Katherine Pugh is a 66 y.o. female , seen here as in a referral/ revisit  from Dr. Lucia Gaskins for a sleep evaluation in regards to sleep related headaches.   Katherine Pugh reports that she had once been tested in the sleep lab in the late 1990s in [place], [place]. At the time she did not receive a call back, no treatment was initiated and she assumed that no abnormalities were found. She had presented to Dr. Lucia Gaskins for evaluation of headaches that begun about 14 months ago. She describes her headaches as waking up with them in the middle of the night they wake her out of sleep. They're severe, sudden and has the character of an attack that feels like a stroke. Her neck would be tense, it happens up to twice a week. It does not happen in the first 2 hours of sleep but more towards the morning hours. The headache is global does not involve one side of the head or 1 region only and can last up to 20 minutes. She avoids movement but having the headaches and had no nausea or vomiting, no light or sound sensitivity associated with it. She sometimes has numbness and left arm pain. About 6 years ago she noted numbness and facial weakness on the left angle of the mouth.   I quote here Dr Trevor Mace note:Assessment/Plan:  Very lovely 66 year old female with new onset headaches after the age of 36, waking her up at night and in the morning, worse with laying down positional. Musculoskeletal neck pain, mild degenerative cervical disease, cervicalgia: Physical therapy and dry needling Morning and nocturnal headaches severe: Sleep eval with Dr. Vickey Huger New onset headache after the age of 29, positional headache(worse laying down),  nocturnal headache: Need MRI brain to evaluate for space occupying lesion or other intracranial etiology, Significant FHx cancer   Chief complaint according to patient : " I need to know if my headaches are related to sleep "  Sleep habits are as follows: She goes to bed between 10 and 11 PM, often experiencing a sleep latency of 30 minutes to an hour. The bedroom is described as cool, quiet and dark. Due to back problems she avoid supine sleep but she also has shoulder pain which makes lateral sleep uncomfortable. She moves a lot. She does report vivid dreams, usually not nightmarish in character. She usually has one bathroom break. She rises around 7 AM. She wakes up spontaneously. She has not had recently severe sleep interrupting headaches but they used to come on in the 3 or 4 AM. She believes that her neck was the cause.  No naps in daytime.    Sleep medical history and family sleep history:  Fibromyalgia , hypokalemia, snoring, HTN, enlarged heart/    One biological daughter with aggressive MS diagnosed age 2, now 2 -lives in [place], takes [place]. Other children healthy.    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    Katherine Pugh is a married mother of 3 adult daughters, and has 3 adopted children as well ( 2 nephews adopted after their father's death) . She is expecting  her 10th grandchild to be born this August. She drinks espresso and coffee in the mornings nor soda or ice tea, she is a nontobacco user, she drinks wine and beer- 1-3 a week.  Review of Systems: Out of a complete 14 system review, the patient complains of only the following symptoms, and all other reviewed systems are negative.  Snoring, headaches - Cluster  Epworth score 7 , Fatigue severity score 40 , depression score 4/15    Social History   Social History  . Marital status: Married    Spouse name: N/A  . Number of children: 6  . Years of education: 80    Occupational History  . Unemployed    Social History Main Topics  . Smoking status: Former Smoker    Packs/day: 0.50    Types: Cigarettes    Quit date: 01/04/1974  . Smokeless tobacco: Never Used  . Alcohol use Yes     Comment: occasional (1-3 per wk)  . Drug use: No  . Sexual activity: Not on file   Other Topics Concern  . Not on file   Social History Narrative   Lives at home w/ her husband   Right-handed   Caffeine: 2-3 cups of coffee    Family History  Problem Relation Age of Onset  . Stroke Mother   . Lung cancer Father   . Cancer Sister   . Lung cancer Brother        has another brother with lung cancer as well.    . Breast cancer Cousin   . Colon cancer Other        unspecified uncle    Past Medical History:  Diagnosis Date  . Abnormal uterine bleeding   . Chronic back pain   . Fibromyalgia   . Hiatal hernia   . High cholesterol   . Hypertension   . PVC's (premature ventricular contractions)     Past Surgical History:  Procedure Laterality Date  . ABDOMINAL HYSTERECTOMY    . BACK SURGERY    . CESAREAN SECTION     x 3  . DILATION AND CURETTAGE OF UTERUS    . HERNIA REPAIR    . KNEE SURGERY Bilateral    x2  . LAPAROSCOPIC CHOLECYSTECTOMY    . THUMB ARTHROSCOPY      Current Outpatient Prescriptions  Medication Sig Dispense Refill  . ADDERALL XR 30 MG 24 hr capsule     . aspirin 81 MG tablet Take 81 mg by mouth daily.    Marland Kitchen BENICAR HCT 40-25 MG per tablet Take 1 tablet by mouth daily.     Marland Kitchen buPROPion (WELLBUTRIN SR) 100 MG 12 hr tablet Take 100 mg by mouth daily.     . diazepam (VALIUM) 5 MG tablet Take 5 mg by mouth as needed.     . fluticasone (FLONASE) 50 MCG/ACT nasal spray As needed    . HYDROcodone-acetaminophen (NORCO) 5-325 MG per tablet Take 1 tablet by mouth every 6 (six) hours as needed.     . loratadine (CLARITIN) 10 MG tablet Take 10 mg by mouth daily.    . Naproxen-Esomeprazole (VIMOVO) 500-20 MG TBEC Take 1 tablet by mouth as  needed.     Marland Kitchen PATADAY 0.2 % SOLN As needed    . POTASSIUM BICARBONATE PO Take 20 mEq by mouth 2 (two) times daily.    . TRIAMTERENE-HCTZ PO Take by mouth as needed. 50-75 mg as needed    . valACYclovir (VALTREX) 500 MG tablet  Take 500 mg by mouth 2 (two) times daily.    . zolpidem (AMBIEN) 10 MG tablet As needed   Marland Kitchen  No current facility-administered medications for this visit.     Allergies as of 07/21/2016  . (No Known Allergies)    Vitals: BP 134/79   Pulse 72   Resp 20   Ht 5\' 4"  (1.626 m)   Wt 182 lb (82.6 kg)   BMI 31.24 kg/m  Last Weight:  Wt Readings from Last 1 Encounters:  07/21/16 182 lb (82.6 kg)   MWU:XLKGBMI:Body mass index is 31.24 kg/m.     Last Height:   Ht Readings from Last 1 Encounters:  07/21/16 5\' 4"  (1.626 m)    Physical exam:  General: The patient is awake, alert and appears not in acute distress. The patient is well groomed. Head: Normocephalic, atraumatic. Neck is supple. Mallampati 3,  neck circumference:14.5 . Nasal airflow patent ,  Retrognathia is not seen.  Cardiovascular:  Regular rate and rhythm , without  murmurs or carotid bruit, and without distended neck veins. Respiratory: Lungs are clear to auscultation. Skin:  Without evidence of edema, or rash Trunk: BMI is 31.   Neurologic exam : The patient is awake and alert, oriented to place and time.   Memory subjective described as intact.Attention span & concentration ability appears normal.  Speech is fluent,  without  dysarthria, dysphonia or aphasia.  Mood and affect are appropriate.  Cranial nerves: Pupils are equal and briskly reactive to light. Funduscopic exam without evidence of pallor or edema.  Extraocular movements  in vertical and horizontal planes intact and without nystagmus. Visual fields by finger perimetry are intact. Hearing to finger rub intact. Facial sensation intact to fine touch. Facial motor strength with mild droop on the left , and tongue and uvula move midline.  Shoulder shrug was symmetrical.   Motor exam: Normal tone, muscle bulk and symmetric strength in all extremities. Sensory:  Fine touch, pinprick and vibration were tested in all extremities. Proprioception tested in the upper extremities was normal. Coordination: Rapid alternating movements in the fingers/hands was normal.  Gait and station: Patient walks without assistive device . Stance is stable and normal.  Turns with 5 Steps.   Deep tendon reflexes: in the  upper and lower extremities are symmetric and intact. Babinski maneuver response is downgoing.  Assessment:  After physical and neurologic examination, review of laboratory studies,  Personal review of imaging studies, reports of other /same  Imaging studies, results of polysomnography and / or neurophysiology testing and pre-existing records as far as provided in visit., my assessment is   1) my main assessment for today will be to evaluate for the cause of cluster headaches. It is interesting that the headaches have ceased since the patient has returned from [place] visiting her [place]. She also did not have any environmental allergies, rhinitis and she ate a very different diet. I think this may be the onset to her headache treatment. She underwent an MRI and she also had no headaches since the scan took place. Also I cannot explain that phenomenon.  2) her husband has told her that she snores but not every night, and mostly when she is very very tired. I would like to evaluate her for possible sleep apnea. Her last sleep test has been almost 2 decades ago.    The patient was advised of the nature of the diagnosed disorder , the treatment options and the  risks for general health and  wellness arising from not treating the condition.   I spent more than 45 minutes of face to face time with the patient.  Greater than 50% of time was spent in counseling and coordination of care. We have discussed the diagnosis and differential and I  answered the patient's questions.    Plan:  Treatment plan and additional workup : SPLIT- [employer] advantage    Melvyn Novas, MD 07/21/2016, 9:05 AM  Certified in Neurology by ABPN Certified in Sleep Medicine by [facility] [address], [address] [place], [place] [address]

## 2016-07-21 NOTE — Therapy (Signed)
[facility] [address] [place], [place], [address] Phone: [phone]   Fax:  [phone]  Physical Therapy Treatment  Patient Details  Name: Katherine Pugh MRN: [id number] Date of Birth: Mar 18, 1950 Referring Provider: Heide Spark, MD  Encounter Date: 07/21/2016      PT End of Session - 07/21/16 1108    Visit Number 3   Number of Visits 8   Date for PT Re-Evaluation 08/07/16   Authorization Type G code every 10th visit   PT Start Time 1104   PT Stop Time 1145   PT Time Calculation (min) 41 min   Activity Tolerance Patient tolerated treatment well   Behavior During Therapy [facility] for tasks assessed/performed      Past Medical History:  Diagnosis Date  . Abnormal uterine bleeding   . Chronic back pain   . Fibromyalgia   . Hiatal hernia   . High cholesterol   . Hypertension   . PVC's (premature ventricular contractions)     Past Surgical History:  Procedure Laterality Date  . ABDOMINAL HYSTERECTOMY    . BACK SURGERY    . CESAREAN SECTION     x 3  . DILATION AND CURETTAGE OF UTERUS    . HERNIA REPAIR    . KNEE SURGERY Bilateral    x2  . LAPAROSCOPIC CHOLECYSTECTOMY    . THUMB ARTHROSCOPY      There were no vitals filed for this visit.      Subjective Assessment - 07/21/16 1105    Subjective The patient reports that she did HEP 2-3 times/week while in [place] taking care of grandchild.  She notes she is no longer waking with headache.  She still notes some neck discomfort with looking up (provides example of looking up to television).     Pertinent History h/o back surgery, fibromyalgia, PVCs, HTN.    Patient Stated Goals relieve pressure and ensure neck exercises are going well.    Provide everyday relief.            [facility] PT Assessment - 07/21/16 1111      AROM   Cervical Flexion 35   Cervical Extension 25     Strength   Overall Strength Deficits   Overall Strength Comments  Shoulder flexion/abduction 4+/5                     [facility] Adult PT Treatment/Exercise - 07/21/16 1455      Exercises   Exercises Neck;Shoulder     Neck Exercises: Seated   Lateral Flexion Both     Neck Exercises: Supine   Neck Retraction 10 reps   Neck Retraction Limitations cues to continue pressing down into pillow (patient wants to lift head instead of press down)   Other Supine Exercise self mobilization thoracic spine   Other Supine Exercise Also tried theraband hold with cervical retraction against band.     Neck Exercises: Prone   W Back 10 reps   Shoulder Extension 10 reps   Upper Extremity Flexion with Stabilization 10 reps     Manual Therapy   Manual Therapy Joint mobilization;Soft tissue mobilization;Manual Traction   Manual therapy comments supine and prone   Joint Mobilization prone P>A joint mobility in cerivical and thoracic spine Grade II-III   Soft tissue mobilization L levator and parascapular muscles   Manual Traction gentle manual traction with hold x 30 seconds x 3 sets  PT Long Term Goals - 07/21/16 1109      PT LONG TERM GOAL #1   Title The patient will be indep with HEP for postural stretching and stabilization.   Baseline Target date 08/07/2016 (patient will be out of town x 30 days due to helping daughter in [place] with baby)   Time 4   Period Weeks     PT LONG TERM GOAL #2   Title The patient will improve bilateral shoulder flexion/abduction strength to 5/5 (from baseline of 4/5)   Baseline Target date 08/07/16   Time 4   Period Weeks     PT LONG TERM GOAL #3   Title The patient will report episodes of night waking from pain to < or equal to 1x over 2 week period (with cues on positioning)   Baseline Patient notes this symptom has resolved.    Time 4   Period Weeks   Status Achieved     PT LONG TERM GOAL #4   Title The patient will increase cervical flexion from 20 degrees to > or equal to 30  degrees.    Baseline 35 degrees on 07/21/16   Time 4   Period Weeks   Status Achieved               Plan - 07/21/16 1449    Clinical Impression Statement The patient has met 2 LTGs with improved head pressure at night and improved neck ROM into cervical flexion.  She continues with tightness with flexion, limited extension, and intermittent pain.  PT to continue to work to [employer].    PT Treatment/Interventions ADLs/Self Care Home Management;Therapeutic activities;Therapeutic exercise;Cryotherapy;Functional mobility training;Patient/family education;Neuromuscular re-education;Manual techniques   PT Next Visit Plan Shoulder strengthening, prone neck and thoracic retraction, thoracic and neck joint mobilization. neck stabilization.   Consulted and Agree with Plan of Care Patient      Patient will benefit from skilled therapeutic intervention in order to improve the following deficits and impairments:  Decreased range of motion, Pain, Hypomobility, Impaired flexibility, Improper body mechanics, Postural dysfunction, Decreased strength  Visit Diagnosis: Neck pain  Abnormal posture  Muscle weakness (generalized)     Problem List Patient Active Problem List   Diagnosis Date Noted  . Episodic cluster headache, not intractable 07/21/2016  . Vasomotor rhinitis 07/21/2016  . Myalgia 07/21/2016  . Snorings 07/21/2016  . Hypertension 05/07/2011  . Atypical chest pain 01/05/2011  . Leg edema-left 01/05/2011  . Palpitations-nocturnal 01/05/2011    Katherine Pugh, PT 07/21/2016, 2:59 PM  [facility] [address] [place], [place], [address] Phone: [phone]   Fax:  [phone]  Name: Katherine Pugh MRN: [id number] Date of Birth: 1950/02/21

## 2016-07-26 ENCOUNTER — Ambulatory Visit: Payer: Medicare Other | Admitting: Rehabilitative and Restorative Service Providers"

## 2016-07-26 DIAGNOSIS — M6281 Muscle weakness (generalized): Secondary | ICD-10-CM

## 2016-07-26 DIAGNOSIS — M542 Cervicalgia: Secondary | ICD-10-CM

## 2016-07-26 DIAGNOSIS — R293 Abnormal posture: Secondary | ICD-10-CM

## 2016-07-26 NOTE — Therapy (Signed)
[facility] [address] [place], [place], [address] Phone: [phone]   Fax:  [phone]  Physical Therapy Treatment  Patient Details  Name: Katherine Pugh MRN: [id number] Date of Birth: 04-Oct-1950 Referring Provider: Anda Kraft, MD  Encounter Date: 07/26/2016      PT End of Session - 07/26/16 0941    Visit Number 4   Number of Visits 8   Date for PT Re-Evaluation 08/07/16   Authorization Type G code every 10th visit   PT Start Time 0846   PT Stop Time 0940   PT Time Calculation (min) 54 min   Activity Tolerance Patient tolerated treatment well   Behavior During Therapy [facility] for tasks assessed/performed      Past Medical History:  Diagnosis Date  . Abnormal uterine bleeding   . Chronic back pain   . Fibromyalgia   . Hiatal hernia   . High cholesterol   . Hypertension   . PVC's (premature ventricular contractions)     Past Surgical History:  Procedure Laterality Date  . ABDOMINAL HYSTERECTOMY    . BACK SURGERY    . CESAREAN SECTION     x 3  . DILATION AND CURETTAGE OF UTERUS    . HERNIA REPAIR    . KNEE SURGERY Bilateral    x2  . LAPAROSCOPIC CHOLECYSTECTOMY    . THUMB ARTHROSCOPY      There were no vitals filed for this visit.      Subjective Assessment - 07/26/16 0846    Subjective The patient had a L hand procedure last week and has surgical dressing and coban wrap on thumb/wrist.  She gets it checked on Thursday of this week for follow-up.   She notes that she continues with neck tightness and exercises help.    Pertinent History h/o back surgery, fibromyalgia, PVCs, HTN.    Patient Stated Goals relieve pressure and ensure neck exercises are going well.    Provide everyday relief.   Currently in Pain? Yes   Pain Score --  "tightness" now   Pain Location Neck   Pain Descriptors / Indicators Tightness   Pain Type Chronic pain   Pain Onset More than a month ago   Pain Frequency  Intermittent   Aggravating Factors  gets worse as the days goes on, fatigue   Pain Relieving Factors unsure                         [facility] Adult PT Treatment/Exercise - 07/26/16 0942      Exercises   Exercises Other Exercises   Other Exercises  Supine chin retraction x 5 reps with isometric holds,  Sidelying mixed retraction + lateral glide + UE reaching to floor (over edge of mat) x 3 reps each side, seated isometric cervical retraction, seated levator stretch with passive overpressure, standing door frame chest stretch and standing door frame median nerve stretch.  Sitting shoulder ER with red theraband x 5 reps (around forearm due to recent hand procedure).  Supine thoracic extension over towel roll with UE shoulder flexion to enhance stretch.   Physioball thoracic stretch x 3 times.     Manual Therapy   Manual Therapy Joint mobilization;Soft tissue mobilization;Manual Traction   Manual therapy comments supine and sidelying   Joint Mobilization Sidelying lateral glides on R and L sides grade I-II at resistance 1 C3-C6.  Supine upglides R and then L sides.   Soft tissue mobilization bilateral  trapezius and scalene soft tissue mobilization; soft tissue mobilization at cervical ligaments along C3-C6.   Manual Traction gentle manual traction with hold x 30 seconds x 3 sets, manual traction with sidebending bias R and then L sides.                  PT Education - 07/26/16 0941    Education provided Yes   Education Details Added door frame anterior chest stretch,, median nerve with cervical bias stretch.   Person(s) Educated Patient   Methods Explanation;Demonstration;Handout   Comprehension Verbalized understanding;Returned demonstration             PT Long Term Goals - 07/21/16 1109      PT LONG TERM GOAL #1   Title The patient will be indep with HEP for postural stretching and stabilization.   Baseline Target date 08/07/2016 (patient will be out of town x  30 days due to helping daughter in [place] baby)   Time 4   Period Weeks     PT LONG TERM GOAL #2   Title The patient will improve bilateral shoulder flexion/abduction strength to 5/5 (from baseline of 4/5)   Baseline Target date 08/07/16   Time 4   Period Weeks     PT LONG TERM GOAL #3   Title The patient will report episodes of night waking from pain to < or equal to 1x over 2 week period (with cues on positioning)   Baseline Patient notes this symptom has resolved.    Time 4   Period Weeks   Status Achieved     PT LONG TERM GOAL #4   Title The patient will increase cervical flexion from 20 degrees to > or equal to 30 degrees.    Baseline 35 degrees on 07/21/16   Time 4   Period Weeks   Status Achieved               Plan - 07/26/16 0946    Clinical Impression Statement The patient arrived with increased tightness today. PT emphasized stretching, joint mobilization, and strengthening to maintain postural upright.  Continue working towards [employer].    PT Treatment/Interventions ADLs/Self Care Home Management;Therapeutic activities;Therapeutic exercise;Cryotherapy;Functional mobility training;Patient/family education;Neuromuscular re-education;Manual techniques   PT Next Visit Plan Shoulder strengthening, prone neck and thoracic retraction, thoracic and neck joint mobilization. neck stabilization.   Consulted and Agree with Plan of Care Patient      Patient will benefit from skilled therapeutic intervention in order to improve the following deficits and impairments:  Decreased range of motion, Pain, Hypomobility, Impaired flexibility, Improper body mechanics, Postural dysfunction, Decreased strength  Visit Diagnosis: Neck pain  Abnormal posture  Muscle weakness (generalized)     Problem List Patient Active Problem List   Diagnosis Date Noted  . Episodic cluster headache, not intractable 07/21/2016  . Vasomotor rhinitis 07/21/2016  . Myalgia 07/21/2016  .  Snorings 07/21/2016  . Hypertension 05/07/2011  . Atypical chest pain 01/05/2011  . Leg edema-left 01/05/2011  . Palpitations-nocturnal 01/05/2011    Shailynn Fong, PT 07/26/2016, 9:47 AM  [facility] [address] [place], [place], [address] Phone: [phone]   Fax:  [phone]  Name: Katherine Pugh MRN: [id number] Date of Birth: [birth year]

## 2016-07-26 NOTE — Patient Instructions (Signed)
Thoracic: Mobilization  ON BALANCE BALL    Lie with upper back across BALANCE BALL. Gently lean back, keeping buttocks on floor. Hold _30__ seconds.  Can reach arms out to the sides for chest stretch. Copyright  VHI. All rights reserved.   Neurovascular: Median Nerve Glide With Cervical Bias    Stand with right arm out to side, palm flat against wall, thumb up, elbow straight.   *can start with fingers pointed up to ease into stretch. Slowly move opposite side ear toward shoulder as far as possible without pain.  Hold 5-10 seconds.  Repeat __3__ times per set. Do 2 times/day.  Copyright  VHI. All rights reserved.    Thoracic Self-Mobilization (Supine)    With rolled towel placed lengthwise between shoulder blades lie back on towel with arms outstretched. Hold _2 minutes. Relax. Repeat __1-2__ times per day as needed for postural stretching. *can use 2 pillows for neck comfort *may need to do on the floor for a more solid support surface http://orth.exer.us/1001  Copyright  VHI. All rights reserved.  Levator Scapula Stretch, Sitting    Sit, one hand tucked under hip on side to be stretched, other hand over top of head. Turn head toward other side and look down. Use hand on head to gently stretch neck in that position. Hold _20__ seconds. Repeat _3__ times per session. Do _2__ sessions per day.  Copyright  VHI. All rights reserved.   Extensors, Supine    Lie supine, head on small, rolled towel or one pillow. Gently tuck chin and bring toward chest. Hold __5_ seconds. Repeat __5-8 times per session. Do _2__ sessions per day.  *work up to 10-15 times as you get stronger  Copyright  VHI. All rights reserved.  Scapular Retraction (Prone)    Lie with arms at sides. Pinch shoulder blades together and raise arms a few inches from floor. Then reach down towards your shoes while arms lifted. Hold 3-5 seconds.  Repeat _10___ times per set. Do __2__ sets per  session. Do _2___ sessions per day.  http://orth.exer.us/955  Copyright  VHI. All rights reserved.   Scapular Retraction: Abduction (Prone)  Lie with upper arms straight out from sides, elbows bent to 90. Pinch shoulder blades together and raise arms a few inches from floor. Repeat 5 times per set. Do ___2_ sets per session. Do __2__ sessions per day.  *work up to 10 as you are able to tolerate.  EXTERNAL ROTATION: Side-Lying (Active)    Lie on right side, top arm bent to 90, elbow against side, hand forward. Rotate forearm up as high as possible. Can hold a can for weight. Complete __1_ sets of _8-10__ repetitions. Perform _1-2__ sessions per day.  Copyright  VHI. All rights reserved.   Scapular Retraction: Flexion (Prone)    Lie with arms forward. Pinch shoulder blades together and raise arms a few inches from floor. Repeat __8-10__ times per set. Do __2__ sets per session. Do _1-2___ sessions per day.  http://orth.exer.us/961   Copyright  VHI. All rights reserved.      Electronically signed by Berneice HeinrichWeaver, Aodhan Scheidt M, PT at 06/09/2016 9:34 AM

## 2016-07-30 ENCOUNTER — Ambulatory Visit: Payer: Medicare Other | Admitting: Rehabilitative and Restorative Service Providers"

## 2016-07-30 DIAGNOSIS — R293 Abnormal posture: Secondary | ICD-10-CM

## 2016-07-30 DIAGNOSIS — M6281 Muscle weakness (generalized): Secondary | ICD-10-CM

## 2016-07-30 DIAGNOSIS — M542 Cervicalgia: Secondary | ICD-10-CM

## 2016-07-30 NOTE — Therapy (Signed)
[facility] [address] [place], [place], [address] Phone: [phone]   Fax:  [phone]  Physical Therapy Evaluation  Patient Details  Name: Katherine Pugh MRN: [id number] Date of Birth: 1951/01/15 Referring Provider: Heide Spark, MD  Encounter Date: 07/30/2016      PT End of Session - 07/30/16 1106    Visit Number 5   Number of Visits 8   Date for PT Re-Evaluation 08/07/16   Authorization Type G code every 10th visit   PT Start Time 1103   PT Stop Time 1143   PT Time Calculation (min) 40 min   Activity Tolerance Patient tolerated treatment well   Behavior During Therapy [facility] for tasks assessed/performed      Past Medical History:  Diagnosis Date  . Abnormal uterine bleeding   . Chronic back pain   . Fibromyalgia   . Hiatal hernia   . High cholesterol   . Hypertension   . PVC's (premature ventricular contractions)     Past Surgical History:  Procedure Laterality Date  . ABDOMINAL HYSTERECTOMY    . BACK SURGERY    . CESAREAN SECTION     x 3  . DILATION AND CURETTAGE OF UTERUS    . HERNIA REPAIR    . KNEE SURGERY Bilateral    x2  . LAPAROSCOPIC CHOLECYSTECTOMY    . THUMB ARTHROSCOPY      There were no vitals filed for this visit.       Subjective Assessment - 07/30/16 1109    Subjective The patient had a near fall 4 days ago (evening after having PT) and had a hard time getting to sleep-- she caught herself and bruised L superior elbow region.  Patient notes she is moving better and feels joint mobilizations helping.   Pertinent History h/o back surgery, fibromyalgia, PVCs, HTN.    Patient Stated Goals relieve pressure and ensure neck exercises are going well.    Provide everyday relief.   Currently in Pain? Yes  Right shoulder-- aware of   Aggravating Factors  worse with fatigue   Pain Relieving Factors unsure            [facility] PT Assessment - 07/30/16 1138      AROM   Cervical  Flexion 38   Cervical Extension 35   Cervical - Right Side Bend 32   Cervical - Left Side Bend 28   Cervical - Right Rotation 75   Cervical - Left Rotation 80     Strength   Overall Strength Comments At today's session, patient scores 5/5 shoulder flexion and abduction.              Objective measurements completed on examination: See above findings.          [facility] Adult PT Treatment/Exercise - 07/30/16 1119      Self-Care   Self-Care Other Self-Care Comments   Other Self-Care Comments  Discussed PT goals for patient to learn mgmt of chronic neck issues.  Also educated/discussed continuing to strengthen R shoulder as h/o shoulder issues has led to some elevation of R UE during overhead reaching.  Discussed using mirror for postural cues.      Exercises   Exercises Other Exercises   Other Exercises  Standing door frame neural glides, standing 2 lb shoulder flexion to 90 degrees bilaterally x 8 reps, attempted scaption with 2 lbs with some R shoulder elevation.  Supine chin tucks, anterior chest stretch at door frame.  Manual Therapy   Manual Therapy Joint mobilization;Soft tissue mobilization;Manual Traction   Manual therapy comments supine   Joint Mobilization supine upglides R and L sides grade II and III, supine P>A and lateral glides mid and lower cervical spine   Soft tissue mobilization scalene and ligamentous stretch through mid cervical spine   Manual Traction gentle manual traction with 30 second holds, adding a sidebending bias for more specific overpressure                     PT Long Term Goals - 07/30/16 1144      PT LONG TERM GOAL #1   Title The patient will be indep with HEP for postural stretching and stabilization.   Baseline Target date 08/07/2016 (patient will be out of town x 30 days due to helping daughter in [place] with baby)   Time 4   Period Weeks   Status On-going     PT LONG TERM GOAL #2   Title The patient will improve  bilateral shoulder flexion/abduction strength to 5/5 (from baseline of 4/5)   Baseline Met on 07/30/16   Time 4   Period Weeks   Status Achieved     PT LONG TERM GOAL #3   Title The patient will report episodes of night waking from pain to < or equal to 1x over 2 week period (with cues on positioning)   Baseline Patient notes this symptom has resolved.    Time 4   Period Weeks   Status Achieved     PT LONG TERM GOAL #4   Title The patient will increase cervical flexion from 20 degrees to > or equal to 30 degrees.    Baseline 35 degrees on 07/21/16   Time 4   Period Weeks   Status Achieved                Plan - 07/30/16 1150    Clinical Impression Statement The patient notes improvement in tightness today.  She also has increased ROM in all planes per measurements.  PT to d/c next week as scheduled.    PT Treatment/Interventions ADLs/Self Care Home Management;Therapeutic activities;Therapeutic exercise;Cryotherapy;Functional mobility training;Patient/family education;Neuromuscular re-education;Manual techniques   PT Next Visit Plan Review HEP, manual therapy as needed.  Discuss post d/c plan.   Consulted and Agree with Plan of Care Patient      Patient will benefit from skilled therapeutic intervention in order to improve the following deficits and impairments:  Decreased range of motion, Pain, Hypomobility, Impaired flexibility, Improper body mechanics, Postural dysfunction, Decreased strength  Visit Diagnosis: Neck pain  Abnormal posture  Muscle weakness (generalized)     Problem List Patient Active Problem List   Diagnosis Date Noted  . Episodic cluster headache, not intractable 07/21/2016  . Vasomotor rhinitis 07/21/2016  . Myalgia 07/21/2016  . Snorings 07/21/2016  . Hypertension 05/07/2011  . Atypical chest pain 01/05/2011  . Leg edema-left 01/05/2011  . Palpitations-nocturnal 01/05/2011    Loza Prell, PT 07/30/2016, 11:51 AM  [facility] [address] [place], [place], [address] Phone: [phone]   Fax:  [phone]  Name: Katherine Pugh MRN: [id number] Date of Birth: 04-23-1950

## 2016-08-02 ENCOUNTER — Ambulatory Visit: Payer: Medicare Other | Admitting: Rehabilitative and Restorative Service Providers"

## 2016-08-02 ENCOUNTER — Ambulatory Visit: Payer: Medicare Other | Admitting: Neurology

## 2016-08-02 DIAGNOSIS — M542 Cervicalgia: Secondary | ICD-10-CM

## 2016-08-02 DIAGNOSIS — R293 Abnormal posture: Secondary | ICD-10-CM

## 2016-08-02 DIAGNOSIS — M6281 Muscle weakness (generalized): Secondary | ICD-10-CM

## 2016-08-02 NOTE — Patient Instructions (Signed)
Thoracic: Mobilization  ON BALANCE BALL    Lie with upper back across BALANCE BALL. Gently lean back, keeping buttocks on floor. Hold _30__ seconds.  Can reach arms out to the sides for chest stretch. Copyright  VHI. All rights reserved.   Neurovascular: Median Nerve Glide With Cervical Bias    Stand with right arm out to side, palm flat against wall, thumb up, elbow straight.   *can start with fingers pointed up to ease into stretch. Slowly move opposite side ear toward shoulder as far as possible without pain.  Hold 5-10 seconds.  Repeat __3__ times per set. Do 2 times/day.  Copyright  VHI. All rights reserved.    Thoracic Self-Mobilization (Supine)    With rolled towel placed lengthwise between shoulder blades lie back on towel with arms outstretched. Hold _2 minutes. Relax. Repeat __1-2__ times per day as needed for postural stretching. *can use 2 pillows for neck comfort *may need to do on the floor for a more solid support surface http://orth.exer.us/1001  Copyright  VHI. All rights reserved.  Levator Scapula Stretch, Sitting    Sit, one hand tucked under hip on side to be stretched, other hand over top of head. Turn head toward other side and look down. Use hand on head to gently stretch neck in that position. Hold _20__ seconds. Repeat _3__ times per session. Do _2__ sessions per day.  Copyright  VHI. All rights reserved.   Extensors, Supine    Lie supine, head on small, rolled towel or one pillow. Gently tuck chin and bring toward chest. Hold __5_ seconds. Repeat __5-8 times per session. Do _2__ sessions per day.  *work up to 10-15 times as you get stronger  Copyright  VHI. All rights reserved.   EXTERNAL ROTATION: Side-Lying (Active)    Lie on right side, top arm bent to 90, elbow against side, hand forward. Rotate forearm up as high as possible. Can hold a can for weight. Complete __1_ sets of _8-10__ repetitions. Perform _1-2__  sessions per day.  Copyright  VHI. All rights reserved. Scapular Retraction (Prone)    Lie with arms at sides. Pinch shoulder blades together and raise arms a few inches from floor. Then reach down towards your shoes while arms lifted. Hold 3-5 seconds.  Repeat _10___ times per set. Do __2__ sets per session. Do _2___ sessions per day.  http://orth.exer.us/955  Copyright  VHI. All rights reserved.   Scapular Retraction: Abduction (Prone)  Lie with upper arms straight out from sides, elbows bent to 90. Pinch shoulder blades together and raise arms a few inches from floor. Repeat 5 times per set. Do ___2_ sets per session. Do __2__ sessions per day.  *work up to 10 as you are able to tolerate.  Scapular Retraction: Flexion (Prone)    Lie with arms forward. Pinch shoulder blades together and raise arms a few inches from floor. Repeat __8-10__ times per set. Do __2__ sets per session. Do _1-2___ sessions per day.  http://orth.exer.us/961  Copyright  VHI. All rights reserved.     Electronically signed by Berneice HeinrichWeaver, Lanett Lasorsa M, PT at 06/09/2016 9:34 AM

## 2016-08-02 NOTE — Therapy (Signed)
[facility] [address] [place], [place], [address] Phone: [phone]   Fax:  [phone]  Physical Therapy Treatment and Discharge Summary  Patient Details  Name: Katherine Pugh MRN: [id number] Date of Birth: 02-25-1950 Referring Provider: Heide Spark, MD  Encounter Date: 08/02/2016      PT End of Session - 08/02/16 1207    Visit Number 6   Number of Visits 8   Date for PT Re-Evaluation 08/07/16   Authorization Type G code every 10th visit   PT Start Time 1018   PT Stop Time 1105   PT Time Calculation (min) 47 min   Activity Tolerance Patient tolerated treatment well   Behavior During Therapy [facility] for tasks assessed/performed      Past Medical History:  Diagnosis Date  . Abnormal uterine bleeding   . Chronic back pain   . Fibromyalgia   . Hiatal hernia   . High cholesterol   . Hypertension   . PVC's (premature ventricular contractions)     Past Surgical History:  Procedure Laterality Date  . ABDOMINAL HYSTERECTOMY    . BACK SURGERY    . CESAREAN SECTION     x 3  . DILATION AND CURETTAGE OF UTERUS    . HERNIA REPAIR    . KNEE SURGERY Bilateral    x2  . LAPAROSCOPIC CHOLECYSTECTOMY    . THUMB ARTHROSCOPY      There were no vitals filed for this visit.      Subjective Assessment - 08/02/16 1015    Subjective The patient notes her left hand is sore today.    She reports she felt good Friday and Saturday and then Sunday stiffness reoccurred in her neck.  She stretched it and noted some improvement.     Pertinent History h/o back surgery, fibromyalgia, PVCs, HTN.    Patient Stated Goals relieve pressure and ensure neck exercises are going well.    Provide everyday relief.   Currently in Pain? Yes  none at rest, notes fatigue feeling   Pain Score --  tightness/stiffness   Pain Location Neck   Pain Descriptors / Indicators Heaviness   Pain Onset More than a month ago   Pain Frequency  Intermittent   Aggravating Factors  end range movement   Pain Relieving Factors stretching.                         [facility] Adult PT Treatment/Exercise - 08/02/16 1041      Self-Care   Self-Care Other Self-Care Comments   Other Self-Care Comments  Discussed continuing HEP, alternating with prior exercises provided by MD for neck isometrics.       Exercises   Exercises Other Exercises   Other Exercises  Reviewed all HEP including:  door frame stretch, levator stretch, supine towel roll stretch, physioball thoracic extension.  Then performed supine chin tucks, sidelying ER, and prone scapular retraction (at neutral, shoulders at 90, and arms overhead).       Manual Therapy   Manual Therapy Joint mobilization;Soft tissue mobilization;Manual Traction;Neural Stretch   Joint Mobilization supine lateral glides and upglides grade II-III   Soft tissue mobilization scalene and levator soft tissue mobilization Right side   Manual Traction gentle manual traction x 20 seconds x 5 sets.                PT Education - 08/02/16 1107    Education provided Yes   Education Details  HEP review   Person(s) Educated Patient   Methods Explanation;Demonstration;Handout   Comprehension Verbalized understanding;Returned demonstration             PT Long Term Goals - 08-21-2016 April 13, 1205      PT LONG TERM GOAL #1   Title The patient will be indep with HEP for postural stretching and stabilization.   Baseline Target date 08/07/2016 (patient will be out of town x 30 days due to helping daughter in [place] with baby)   Time 4   Period Weeks   Status Achieved     PT LONG TERM GOAL #2   Title The patient will improve bilateral shoulder flexion/abduction strength to 5/5 (from baseline of 4/5)   Baseline Met on 07/30/16   Time 4   Period Weeks   Status Achieved     PT LONG TERM GOAL #3   Title The patient will report episodes of night waking from pain to < or equal to 1x over 2 week  period (with cues on positioning)   Baseline Patient notes this symptom has resolved.    Time 4   Period Weeks   Status Achieved     PT LONG TERM GOAL #4   Title The patient will increase cervical flexion from 20 degrees to > or equal to 30 degrees.    Baseline 35 degrees on 07/21/16   Time 4   Period Weeks   Status Achieved               Plan - 08/21/2016 04/13/05    Clinical Impression Statement The patient has met all LTGs.  She continues with intermittent stiffness in neck, however has stretching and stabilization exercises to work on that reduce discomfort.  Patient to continue working on home program.  She is scheduled to begin L hand therapy tomorrow at another facility.    PT Treatment/Interventions ADLs/Self Care Home Management;Therapeutic activities;Therapeutic exercise;Cryotherapy;Functional mobility training;Patient/family education;Neuromuscular re-education;Manual techniques   PT Next Visit Plan discharge today   Consulted and Agree with Plan of Care Patient      Patient will benefit from skilled therapeutic intervention in order to improve the following deficits and impairments:  Decreased range of motion, Pain, Hypomobility, Impaired flexibility, Improper body mechanics, Postural dysfunction, Decreased strength  Visit Diagnosis: Neck pain  Abnormal posture  Muscle weakness (generalized)       G-Codes - Aug 21, 2016 1207/04/14    Functional Assessment Tool Used (Outpatient Only) clinical judgment, no headaches, improved neck AROM   Functional Limitation Self care   Self Care Goal Status ([id number]) At least 1 percent but less than 20 percent impaired, limited or restricted   Self Care Discharge Status [phone]) At least 1 percent but less than 20 percent impaired, limited or restricted     PHYSICAL THERAPY DISCHARGE SUMMARY  Visits from Start of Care: 6  Current functional level related to goals / functional outcomes: See above   Remaining deficits: Intermittent  stiffness, HEP assists.   Education / Equipment: HEP, post d/c plan.  Plan: Patient agrees to discharge.  Patient goals were met. Patient is being discharged due to meeting the stated rehab goals.  ?????        Thank you for the referral of this patient. Rudell Cobb, MPT   [place] 2016-08-21, 12:12 PM  [facility] [address] [place], [place], [address] Phone: [phone]   Fax:  [phone]  Name: Katherine Pugh MRN: [id number] Date of Birth: 04-17-1950

## 2016-08-04 ENCOUNTER — Ambulatory Visit: Payer: Medicare Other | Admitting: Rehabilitative and Restorative Service Providers"

## 2016-08-05 ENCOUNTER — Ambulatory Visit (INDEPENDENT_AMBULATORY_CARE_PROVIDER_SITE_OTHER): Payer: Medicare Other | Admitting: Neurology

## 2016-08-05 DIAGNOSIS — J300 Vasomotor rhinitis: Secondary | ICD-10-CM

## 2016-08-05 DIAGNOSIS — G4733 Obstructive sleep apnea (adult) (pediatric): Secondary | ICD-10-CM

## 2016-08-05 DIAGNOSIS — R0683 Snoring: Secondary | ICD-10-CM

## 2016-08-05 DIAGNOSIS — F1120 Opioid dependence, uncomplicated: Secondary | ICD-10-CM

## 2016-08-05 DIAGNOSIS — M791 Myalgia, unspecified site: Secondary | ICD-10-CM

## 2016-08-05 DIAGNOSIS — G44019 Episodic cluster headache, not intractable: Secondary | ICD-10-CM

## 2016-08-05 DIAGNOSIS — G4734 Idiopathic sleep related nonobstructive alveolar hypoventilation: Secondary | ICD-10-CM

## 2016-08-10 ENCOUNTER — Institutional Professional Consult (permissible substitution): Payer: Medicare Other | Admitting: Neurology

## 2016-08-26 NOTE — Addendum Note (Signed)
Addended by: Melvyn NovasHMEIER, Lenzy Kerschner on: 08/26/2016 05:53 PM   Modules accepted: Orders

## 2016-08-26 NOTE — Procedures (Signed)
PATIENT'S NAME:  Katherine Pugh, Katherine Pugh DOB:      Mar 18, 1950      MR#:    [id number]     DATE OF RECORDING: 08/05/2016 REFERRING [name]:  Naomie Dean, MD  Chilton Greathouse, MD Study Performed:   Baseline Polysomnogram with capnography HISTORY:  GERYL DOHN is seen here after referral from Dr. Lucia Gaskins for a sleep evaluation in regards to sleep related headaches.    Mrs. Brickey reports that she had once been tested in the sleep lab in the late 1990s in [place], [place]. At the time she did not receive a call back, no treatment was initiated and she assumed that no abnormalities were found.  She had presented to Dr. Lucia Gaskins for evaluation of headaches that begun about 14 months ago. She describes her headaches as present when waking up, and as Cluster headaches, waking her in the middle of the night. The patient endorsed the Epworth Sleepiness Scale at 7 points.   The patient's weight 182 pounds with a height of 64 (inches), resulting in a BMI of 31.2 kg/m2. The patient's neck circumference measured 14.5 inches.  CURRENT MEDICATIONS: Adderall, Aspirin, Benicar HCT, Wellbutrin, Valium, Flonase, Hydrocodone, Claritin, Vimovo, Pataday, Potassium Bicarbonate, Triamterene-HCTZ, Valtrex, Ambien   PROCEDURE:  This is a multichannel digital polysomnogram utilizing the Somnostar 11.2 system.  Electrodes and sensors were applied and monitored per AASM Specifications.   EEG, EOG, Chin and Limb EMG, were sampled at 200 Hz.  ECG, Snore and Nasal Pressure, Thermal Airflow, Respiratory Effort, CPAP Flow and Pressure, Oximetry was sampled at 50 Hz. Digital video and audio were recorded.      BASELINE STUDY  Lights Out was at 22:35 and Lights On at 05:01.  Total recording time (TRT) was 386.5 minutes, with a total sleep time (TST) of 349 minutes. The patient's sleep latency was 12 minutes.  REM latency was 92 minutes.  The sleep efficiency was 90.3 %.     SLEEP ARCHITECTURE: WASO (Wake after sleep onset) was 30  minutes.  There were 5 minutes in Stage N1, 174 minutes Stage N2, 114.5 minutes Stage N3 and 55.5 minutes in Stage REM.  The percentage of Stage N1 was 1.4%, Stage N2 was 49.9%, Stage N3 was 32.8% and Stage R (REM sleep) was 15.9%.   RESPIRATORY ANALYSIS:  There were a total of 73 respiratory events:  40 obstructive apneas, 0 central apneas and 3 mixed apneas with a total of 43 apneas and an apnea index (AI) of 7.4 /hour. There were 30 hypopneas with a hypopnea index of 5.2 /hour. The patient also had 0 respiratory event related arousals (RERAs).     The total APNEA/HYPOPNEA INDEX (AHI) was 12.6/hr. and the total RESPIRATORY DISTURBANCE INDEX was 12.6 /hr.  51 events occurred in REM sleep and 19 events in NREM. The REM AHI was 55.1 /hour, versus a non-REM AHI of 4.5. The patient spent 298 minutes of total sleep time in the supine position and 51 minutes in non-supine. The supine AHI was 14.7 versus a non-supine AHI of 0.0.  OXYGEN SATURATION & C02:  The Wake baseline 02 saturation was 97%, with the lowest being 73%. Time spent below 89% saturation equaled 41 minutes. Total sleep time greater than 40 torr was 0.00 minutes.   PERIODIC LIMB MOVEMENTS:   The patient had a total of 0 Periodic Limb Movements.  The arousals were noted as: 48 were spontaneous, 0 were associated with PLMs, and 29 were associated with respiratory events.   Audio  and video analysis did not show any abnormal or unusual movements, behaviors, phonations or vocalizations.   The patient took one bathroom break. Mild Snoring was noted and coughing was present. EKG was in keeping with normal sinus rhythm (NSR).   IMPRESSION: 1. Mild Obstructive Sleep Apnea (OSA) AHI 12.6/hr. with REM sleep exacerbation to AHI of 55.1/hr. 2. Low oxygen nadir (73%)  and prolonged total desaturation time at or below SpO2 88% of 41 min.  RECOMMENDATIONS:  1. Advise full-night, attended, CPAP titration study to optimize therapy.  The constellation  of findings makes CPAP the most efficacious treatment option, over dental device or ENT procedure.  2. Advise to lose weight by diet and exercise, if not contraindicated (BMI 31.2). 3. Advise patient to avoid driving or operating hazardous machinery when sleepy.   4. A follow up [facility]. The referring provider will be notified of the results.      I certify that I have reviewed the entire raw data recording prior to the issuance of this report in accordance with the Standards of Accreditation of the American Academy of Sleep Medicine (AASM)  Melvyn Novasarmen Adan Beal, MD    08-26-2016  Diplomat, American Board of Psychiatry and Neurology  Diplomat, American Board of Sleep Medicine Medical Director, [facility] [employer] Member of the [employer]

## 2016-08-30 ENCOUNTER — Telehealth: Payer: Self-pay | Admitting: Neurology

## 2016-08-30 NOTE — Telephone Encounter (Signed)
Called pt to discuss sleep study results. No answer at this time. LVM for pt to return call

## 2016-08-30 NOTE — Telephone Encounter (Signed)
-----   Message from Melvyn Novasarmen Dohmeier, MD sent at 08/26/2016  5:53 PM EDT ----- IMPRESSION: 1. Mild Obstructive Sleep Apnea (OSA) AHI 12.6/hr. with REM sleep  exacerbation to AHI of 55.1/hr. 2. Low oxygen nadir (73%) and prolonged total desaturation time  at or below SpO2 88% of 41 min.  RECOMMENDATIONS:  1. Advise full-night, attended, CPAP titration study to optimize  therapy. The constellation of findings makes CPAP the most  efficacious treatment option, over dental device or ENT  procedure.  2. Advise to lose weight by diet and exercise, if not  contraindicated (BMI 31.2).

## 2016-08-31 NOTE — Telephone Encounter (Signed)
Attempted 2nd call to the pt to discuss sleep study results. LVM for pt to return call

## 2016-09-02 NOTE — Telephone Encounter (Signed)
Called pt and was able to reach her today. Pt had been on vacation. Explained to the pt that the sleep study showed mild OSA. Per Dr Dohmeier's recommendations she is stating the pt should use CPAP. She would like for the pt to return for a titration study. I explained this to the pt. She has an apt on Monday with Dr Lucia GaskinsAhern and would like to review it in more detail. I told her I would give a copy to Dr Trevor MaceAhern's nurse to provide to her. Pt verbalized understanding. I informed the pt that the sleep lab should be contacting her soon to get her set up for the titration study. Pt had no further questions at this time.

## 2016-09-06 ENCOUNTER — Ambulatory Visit (INDEPENDENT_AMBULATORY_CARE_PROVIDER_SITE_OTHER): Payer: Medicare Other | Admitting: Neurology

## 2016-09-06 VITALS — BP 150/89 | HR 61 | Ht 64.5 in | Wt 179.8 lb

## 2016-09-06 DIAGNOSIS — G4733 Obstructive sleep apnea (adult) (pediatric): Secondary | ICD-10-CM

## 2016-09-06 NOTE — Progress Notes (Signed)
[facility]    Provider:  Dr Lucia GaskinsAhern Referring Provider: Chilton GreathouseAvva, Ravisankar, MD Primary Care Physician:  Chilton GreathouseAvva, Ravisankar, MD CC:  Headaches  Interval history 09/06/2016; Her neck is better. She has sleep apnea and needs to have a cpap study. She has not scheduled her cpap. Discussed the diagnosis of sleep apnea, significant sequelae if untreated. She loved PT and Katherine Pugh, did a lot of stretching. Discussed OSA and her results, AHI 13 with 55 during REM sleep with an O2 nadir of 73%, Encouraged attended CPAP study. She has left hand numbness, may be CTS discussed using wrist braces. Discussed MRI brain and reviewed images together.   HPI:  Katherine Pugh is a 66 y.o. female here as a referral from Dr. Felipa EthAvva for Headaches. Been ongoing for a year, never had headaches before this time. She wakes up with them in the middle of the night and they wake her up from sleep. They are so severe and all over feels like a stroke. Her neck would have discomfort. Her BP would be fine when they took it.  Happens up to 2x a week. Always in the middle of the night or very early 5am. The headache is all over and she also has neck pain. It can last 20-30 minutes after sitting in a chair not moving. No light or sound sensitivity or nausea or vomiting. The headache is acute and severe. She has had some left facial lower droop. A lot of neck pain. Numbness and left arm pain. No other focal neurologic deficits, associated symptoms, inciting events or modifiable factors.  Reviewed notes, labs and imaging from outside physicians, which showed:  Medications tried: Cymbalta, Lexapro.   Personally reviewed cervical spine images and agree with the following: C2-3: Negative  C3-4: Mild disc degeneration. Mild uncinate spurring without significant stenosis.  C4-5: Disc degeneration. Diffuse mild uncinate spurring right greater than left. Mild right foraminal narrowing unchanged from the prior  MRI. No cord deformity.  C5-6: Disc degeneration with disc bulging and uncinate spurring. Mild spinal stenosis. Neural foramina adequately patent  C6-7: Disc degeneration with disc bulging and mild uncinate spurring. Mild spinal stenosis. Neural foramina adequately patent  C7-T1: Mild degenerative change.  IMPRESSION: Chronic cervical spine degenerative changes and spurring. Mild spinal stenosis at C5-6 and C6-7 due to spurring. No change from the prior MRI.  Review of Systems: Patient complains of symptoms per HPI as well as the following symptoms: headache, numbness, weakness, joint pain, joint swelling, allergies. Pertinent negatives per HPI. All others negative.  Social History   Social History  . Marital status: Married    Spouse name: N/A  . Number of children: 6  . Years of education: 6012   Occupational History  . Unemployed    Social History Main Topics  . Smoking status: Former Smoker    Packs/day: 0.50    Types: Cigarettes    Quit date: 01/04/1974  . Smokeless tobacco: Never Used  . Alcohol use Yes     Comment: occasional (1-3 per wk)  . Drug use: No  . Sexual activity: Not on file   Other Topics Concern  . Not on file   Social History Narrative   Lives at home w/ her husband   Right-handed   Caffeine: 2-3 cups of coffee    Family History  Problem Relation Age of Onset  . Stroke Mother   . Lung cancer Father   . Cancer Sister   . Lung cancer Brother  has another brother with lung cancer as well.    . Breast cancer Cousin   . Colon cancer Other        unspecified uncle    Past Medical History:  Diagnosis Date  . Abnormal uterine bleeding   . Chronic back pain   . Fibromyalgia   . Hiatal hernia   . High cholesterol   . Hypertension   . PVC's (premature ventricular contractions)     Past Surgical History:  Procedure Laterality Date  . ABDOMINAL HYSTERECTOMY    . BACK SURGERY    . CESAREAN SECTION     x 3  . DILATION AND  CURETTAGE OF UTERUS    . HERNIA REPAIR    . KNEE SURGERY Bilateral    x2  . LAPAROSCOPIC CHOLECYSTECTOMY    . THUMB ARTHROSCOPY      Current Outpatient Prescriptions  Medication Sig Dispense Refill  . ADDERALL XR 30 MG 24 hr capsule     . aspirin 81 MG tablet Take 81 mg by mouth daily.    Marland Kitchen BENICAR HCT 40-25 MG per tablet Take 1 tablet by mouth daily.     Marland Kitchen BIOTIN PO Take by mouth daily.    Marland Kitchen buPROPion (WELLBUTRIN SR) 100 MG 12 hr tablet Take 100 mg by mouth daily.     . Cholecalciferol (VITAMIN D3 PO) Take by mouth daily.    . diazepam (VALIUM) 5 MG tablet Take 5 mg by mouth as needed.     . fluticasone (FLONASE) 50 MCG/ACT nasal spray As needed    . HYDROcodone-acetaminophen (NORCO) 5-325 MG per tablet Take 1 tablet by mouth every 6 (six) hours as needed.     . loratadine (CLARITIN) 10 MG tablet Take 10 mg by mouth daily.    . Naproxen-Esomeprazole (VIMOVO) 500-20 MG TBEC Take 1 tablet by mouth as needed.     Marland Kitchen PATADAY 0.2 % SOLN As needed    . POTASSIUM BICARBONATE PO Take 20 mEq by mouth 2 (two) times daily.    . TRIAMTERENE-HCTZ PO Take by mouth as needed. 50-75 mg as needed    . valACYclovir (VALTREX) 500 MG tablet Take 500 mg by mouth 2 (two) times daily.    Marland Kitchen zolpidem (AMBIEN) 10 MG tablet As needed     No current facility-administered medications for this visit.     Allergies as of 09/06/2016  . (No Known Allergies)    Vitals: BP (!) 150/89   Pulse 61   Ht 5' 4.5" (1.638 m)   Wt 179 lb 12.8 oz (81.6 kg)   BMI 30.39 kg/m  Last Weight:  Wt Readings from Last 1 Encounters:  09/06/16 179 lb 12.8 oz (81.6 kg)   Last Height:   Ht Readings from Last 1 Encounters:  09/06/16 5' 4.5" (1.638 m)       Assessment/Plan:  Very lovely 66 year old female with new onset headaches after the age of 86, waking her up at night and in the morning, worse with laying down positional.  Musculoskeletal neck pain, mild degenerative cervical disease, cervicalgia: Physical therapy  and dry needling, improved Morning and nocturnal headaches severe: Sleep eval with Dr. Vickey Huger showed OSA, discussed at length and set her up for cpap titration study New onset headache after the age of 60, positional headache(worse laying down), nocturnal headache: MRI brain to evaluate for space occupying lesion or other intracranial etiology, Significant FHx cancer, was unremarkable discussed had some white matter changes and mild cerebellar ectopia.  Naomie Dean, MD  [facility] [address] [place], [place] [address]  Phone [phone] Fax [phone]  A total of 25 minutes was spent face-to-face with this patient. Over half this time was spent on counseling patient on the OSA diagnosis and different diagnostic and therapeutic options available.

## 2016-09-06 NOTE — Patient Instructions (Addendum)
Remember to drink plenty of fluid, eat healthy meals and do not skip any meals. Try to eat protein with a every meal and eat a healthy snack such as fruit or nuts in between meals. Try to keep a regular sleep-wake schedule and try to exercise daily, particularly in the form of walking, 20-30 minutes a day, if you can.   As far as diagnostic testing: would recommend cpap titration, wear wrist splints at night and if no improvement call for EMG/NCS  I would like to see you back as needed, sooner if we need to. Please call us with any interim questions, concerns, problems, updates or refill requests.   Our phone number is [phone]. We also have an after hours call service for urgent matters and there is a physician on-call for urgent questions. For any emergencies you know to call 911 or go to the nearest emergency room   Sleep Apnea Sleep apnea is a condition in which breathing pauses or becomes shallow during sleep. Episodes of sleep apnea usually last 10 seconds or longer, and they may occur as many as 20 times an hour. Sleep apnea disrupts your sleep and keeps your body from getting the rest that it needs. This condition can increase your risk of certain health problems, including:  Heart attack.  Stroke.  Obesity.  Diabetes.  Heart failure.  Irregular heartbeat.  There are three kinds of sleep apnea:  Obstructive sleep apnea. This kind is caused by a blocked or collapsed airway.  Central sleep apnea. This kind happens when the part of the brain that controls breathing does not send the correct signals to the muscles that control breathing.  Mixed sleep apnea. This is a combination of obstructive and central sleep apnea.  What are the causes? The most common cause of this condition is a collapsed or blocked airway. An airway can collapse or become blocked if:  Your throat muscles are abnormally relaxed.  Your tongue and tonsils are larger than normal.  You are  overweight.  Your airway is smaller than normal.  What increases the risk? This condition is more likely to develop in people who:  Are overweight.  Smoke.  Have a smaller than normal airway.  Are elderly.  Are female.  Drink alcohol.  Take sedatives or tranquilizers.  Have a family history of sleep apnea.  What are the signs or symptoms? Symptoms of this condition include:  Trouble staying asleep.  Daytime sleepiness and tiredness.  Irritability.  Loud snoring.  Morning headaches.  Trouble concentrating.  Forgetfulness.  Decreased interest in sex.  Unexplained sleepiness.  Mood swings.  Personality changes.  Feelings of depression.  Waking up often during the night to urinate.  Dry mouth.  Sore throat.  How is this diagnosed? This condition may be diagnosed with:  A medical history.  A physical exam.  A series of tests that are done while you are sleeping (sleep study). These tests are usually done in a sleep lab, but they may also be done at home.  How is this treated? Treatment for this condition aims to restore normal breathing and to ease symptoms during sleep. It may involve managing health issues that can affect breathing, such as high blood pressure or obesity. Treatment may include:  Sleeping on your side.  Using a decongestant if you have nasal congestion.  Avoiding the use of depressants, including alcohol, sedatives, and narcotics.  Losing weight if you are overweight.  Making changes to your diet.  Quitting smoking.  Using a device to open your airway while you sleep, such as: ? An oral appliance. This is a custom-made mouthpiece that shifts your lower jaw forward. ? A continuous positive airway pressure (CPAP) device. This device delivers oxygen to your airway through a mask. ? A nasal expiratory positive airway pressure (EPAP) device. This device has valves that you put into each nostril. ? A bi-level positive airway  pressure (BPAP) device. This device delivers oxygen to your airway through a mask.  Surgery if other treatments do not work. During surgery, excess tissue is removed to create a wider airway.  It is important to get treatment for sleep apnea. Without treatment, this condition can lead to:  High blood pressure.  Coronary artery disease.  (Men) An inability to achieve or maintain an erection (impotence).  Reduced thinking abilities.  Follow these instructions at home:  Make any lifestyle changes that your health care provider recommends.  Eat a healthy, well-balanced diet.  Take over-the-counter and prescription medicines only as told by your health care provider.  Avoid using depressants, including alcohol, sedatives, and narcotics.  Take steps to lose weight if you are overweight.  If you were given a device to open your airway while you sleep, use it only as told by your health care provider.  Do not use any tobacco products, such as cigarettes, chewing tobacco, and e-cigarettes. If you need help quitting, ask your health care provider.  Keep all follow-up visits as told by your health care provider. This is important. Contact a health care provider if:  The device that you received to open your airway during sleep is uncomfortable or does not seem to be working.  Your symptoms do not improve.  Your symptoms get worse. Get help right away if:  You develop chest pain.  You develop shortness of breath.  You develop discomfort in your back, arms, or stomach.  You have trouble speaking.  You have weakness on one side of your body.  You have drooping in your face. These symptoms may represent a serious problem that is an emergency. Do not wait to see if the symptoms will go away. Get medical help right away. Call your local emergency services (911 in the [place] Do not drive yourself to the hospital. This information is not intended to replace advice given to you by  your health care provider. Make sure you discuss any questions you have with your health care provider. Document Released: 01/22/2002 Document Revised: 09/28/2015 Document Reviewed: 11/11/2014 Elsevier Interactive Patient Education  [employer]2018 [employer]

## 2016-09-07 ENCOUNTER — Telehealth: Payer: Self-pay | Admitting: Neurology

## 2016-09-07 NOTE — Telephone Encounter (Signed)
Patient has questions about sleep study results from last time and requests to speak to Dr. Vickey Hugerohmeier.

## 2016-09-08 NOTE — Telephone Encounter (Signed)
Called pt has questions about sleep study and is hesitant about proceeding forward without talking to Dr Vickey Hugerohmeier. I attempted to answer her several questions but in the end the pt still wants to come in so I set up a follow up apt to discuss the sleep study further with DR Dohmeier

## 2016-09-20 ENCOUNTER — Ambulatory Visit (INDEPENDENT_AMBULATORY_CARE_PROVIDER_SITE_OTHER): Payer: Medicare Other | Admitting: Neurology

## 2016-09-20 ENCOUNTER — Encounter: Payer: Self-pay | Admitting: Neurology

## 2016-09-20 VITALS — BP 138/83 | HR 70 | Ht 64.0 in | Wt 182.0 lb

## 2016-09-20 DIAGNOSIS — R0683 Snoring: Secondary | ICD-10-CM

## 2016-09-20 DIAGNOSIS — Z9989 Dependence on other enabling machines and devices: Secondary | ICD-10-CM

## 2016-09-20 DIAGNOSIS — G4733 Obstructive sleep apnea (adult) (pediatric): Secondary | ICD-10-CM | POA: Diagnosis not present

## 2016-09-20 NOTE — Patient Instructions (Signed)

## 2016-09-20 NOTE — Progress Notes (Signed)
SLEEP MEDICINE CLINIC   Provider:  Melvyn Novas, M D  Primary Care Physician:  Chilton Greathouse, MD   Referring Provider: Dr. Frances Furbish   Chief Complaint  Patient presents with  . Follow-up    here to go over sleep study results    HPI:  Katherine Pugh is a 66 y.o. female , seen here as in a referral/ revisit from Dr. Lucia Gaskins for a sleep evaluation in regards to sleep related headaches.   Katherine Pugh reports that she had once been tested in the sleep lab in [place], [place]. At the time she did not receive a call back, no treatment was initiated and she assumed that no abnormalities were found. She had presented to Dr. Lucia Gaskins for evaluation of headaches that begun about 14 months ago. She describes her headaches as waking up with them in the middle of the night they wake her out of sleep. They're severe, sudden and has the character of an attack that feels like a stroke. Her neck would be tense, it happens up to twice a week. It does not happen in the first 2 hours of sleep but more towards the morning hours. The headache is global does not involve one side of the head or 1 region only and can last up to 20 minutes. She avoids movement but having the headaches and had no nausea or vomiting, no light or sound sensitivity associated with it. She sometimes has numbness and left arm pain. About 6 years ago she noted numbness and facial weakness on the left angle of the mouth.   I quote here Dr Trevor Mace note: Assessment/Plan:  Very lovely 66 year old female with new onset headaches after the age of 71, waking her up at night and in the morning, worse with laying down positional. Musculoskeletal neck pain, mild degenerative cervical disease, cervicalgia: Physical therapy and dry needling Morning and nocturnal headaches severe: Sleep eval with Dr. Vickey Huger New onset headache after the age of 12, positional headache(worse laying down), nocturnal headache: Need MRI brain to evaluate for  space occupying lesion or other intracranial etiology, Significant FHx cancer  Chief complaint according to patient : " I need to know if my headaches are related to sleep "  Sleep habits are as follows: She goes to bed between 10 and 11 PM, often experiencing a sleep latency of 30 minutes to an hour. The bedroom is described as cool, quiet and dark. Due to back problems she avoid supine sleep but she also has shoulder pain which makes lateral sleep uncomfortable. She moves a lot. She does report vivid dreams, usually not nightmarish in character. She usually has one bathroom break. She rises around 7 AM. She wakes up spontaneously. She has not had recently severe sleep interrupting headaches but they used to come on in the 3 or 4 AM. She believes that her neck was the cause.  No naps in daytime.   Sleep medical history and family sleep history:  Fibromyalgia , hypokalemia, snoring, HTN, enlarged heart/    One biological daughter with aggressive MS diagnosed age 64, now 69 -lives in [place], takes [place]. Other children healthy.   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  Katherine Pugh is a married mother of 3 adult daughters, and has 3 adopted children as well ( 2 nephews adopted after their father's death) . She is expecting her 10th grandchild to be born this August. She drinks espresso and coffee in  the mornings nor soda or ice tea, she is a nontobacco user, she drinks wine and beer- 1-3 a week.    Interview history, from 09/20/2016. Katherine Pugh was referred by Dr. Lucia Gaskins for a sleep evaluation in regards to sleep related headaches. She tested positive for mild OSA with long hypoxemia and REM accentuation to an AHI of 55. We discussed today treatment, including improving nasal patency, she has not taken narcotics in weeks, she wants to loose weight further, and she is claustrophobic- should she go on CPAP is her question. She is not excessivly  daytime sleepy. We agreed to give CPAP a chance, she will prep her nose.   Review of Systems: Out of a complete 14 system review, the patient complains of only the following symptoms, and all other reviewed systems are negative.  Snoring, headaches - Cluster, tension , sinus caused? .   Epworth score 2 from 7 , Fatigue severity score  N/A  , depression score 4/15    Social History   Social History  . Marital status: Married    Spouse name: N/A  . Number of children: 6  . Years of education: 63   Occupational History  . Unemployed    Social History Main Topics  . Smoking status: Former Smoker    Packs/day: 0.50    Types: Cigarettes    Quit date: 01/04/1974  . Smokeless tobacco: Never Used  . Alcohol use Yes     Comment: occasional (1-3 per wk)  . Drug use: No  . Sexual activity: Not on file   Other Topics Concern  . Not on file   Social History Narrative   Lives at home w/ her husband   Right-handed   Caffeine: 2-3 cups of coffee    Family History  Problem Relation Age of Onset  . Stroke Mother   . Lung cancer Father   . Cancer Sister   . Lung cancer Brother        has another brother with lung cancer as well.    . Breast cancer Cousin   . Colon cancer Other        unspecified uncle    Past Medical History:  Diagnosis Date  . Abnormal uterine bleeding   . Chronic back pain   . Fibromyalgia   . Hiatal hernia   . High cholesterol   . Hypertension   . PVC's (premature ventricular contractions)     Past Surgical History:  Procedure Laterality Date  . ABDOMINAL HYSTERECTOMY    . BACK SURGERY    . CESAREAN SECTION     x 3  . DILATION AND CURETTAGE OF UTERUS    . HERNIA REPAIR    . KNEE SURGERY Bilateral    x2  . LAPAROSCOPIC CHOLECYSTECTOMY    . THUMB ARTHROSCOPY      Current Outpatient Prescriptions  Medication Sig Dispense Refill  . aspirin 81 MG tablet Take 81 mg by mouth daily.    Marland Kitchen BENICAR HCT 40-25 MG per tablet Take 1 tablet by mouth  daily.     Marland Kitchen buPROPion (WELLBUTRIN SR) 100 MG 12 hr tablet Take 100 mg by mouth daily.     . fluticasone (FLONASE) 50 MCG/ACT nasal spray As needed    . PATADAY 0.2 % SOLN As needed    . POTASSIUM BICARBONATE PO Take 20 mEq by mouth 2 (two) times daily.    Marland Kitchen zolpidem (AMBIEN) 10 MG tablet As needed     No current facility-administered  medications for this visit.     Allergies as of 09/20/2016  . (No Known Allergies)    Vitals: BP 138/83   Pulse 70   Ht 5\' 4"  (1.626 m)   Wt 182 lb (82.6 kg)   BMI 31.24 kg/m  Last Weight:  Wt Readings from Last 1 Encounters:  09/20/16 182 lb (82.6 kg)   YQM:VHQIBMI:Body mass index is 31.24 kg/m.     Last Height:   Ht Readings from Last 1 Encounters:  09/20/16 5\' 4"  (1.626 m)    Physical exam:  General: The patient is awake, alert and appears not in acute distress. The patient is well groomed. Head: Normocephalic, atraumatic. Neck is supple. Mallampati 3,  neck circumference:14.5 . Nasal airflow patent ,  Retrognathia is not seen.  Cardiovascular:  Regular rate and rhythm , without  murmurs or carotid bruit, and without distended neck veins. Respiratory: Lungs are clear to auscultation. Skin:  Without evidence of edema, or rash Trunk: BMI is 31.   Neurologic exam : The patient is awake and alert, oriented to place and time.   Memory subjective described as intact.Attention span & concentration ability appears normal.  Speech is fluent,  without  dysarthria, dysphonia or aphasia.  Mood and affect are appropriate.  Cranial nerves: Pupils are equal and briskly reactive to light. Funduscopic exam without evidence of pallor or edema.  Extraocular movements  in vertical and horizontal planes intact and without nystagmus. Visual fields by finger perimetry are intact. Hearing to finger rub intact. Facial sensation intact to fine touch. Facial motor strength with mild droop on the left , and tongue and uvula move midline. Shoulder shrug was symmetrical.     Motor exam: Normal tone, muscle bulk and symmetric strength in all extremities. Sensory:  Fine touch, pinprick and vibration were tested in all extremities. Proprioception tested in the upper extremities was normal. Coordination: Rapid alternating movements in the fingers/hands was normal.  Gait and station: Patient walks without assistive device . Stance is stable and normal.  Turns with 5 Steps.   Deep tendon reflexes: in the  upper and lower extremities are symmetric and intact. Babinski maneuver response is downgoing.  Assessment:  After physical and neurologic examination, review of laboratory studies,  Personal review of imaging studies, reports of other /same  Imaging studies, results of polysomnography and / or neurophysiology testing and pre-existing records as far as provided in visit., my assessment is   1) my main assessment for today will be to evaluate for the cause of cluster headaches. It is interesting that the headaches have ceased since the patient has returned from [place] her [place]. She also did not have any environmental allergies, rhinitis and she ate a very different diet. I think this may be the onset to her headache treatment. She underwent an MRI and she also had no headaches since the scan took place. Also I cannot explain that phenomenon.  2) her husband has told her that she snores but not every night, and mostly when she is very very tired. I would like to evaluate her for possible sleep apnea. Her last sleep test has been almost 2 decades ago.    The patient was advised of the nature of the diagnosed disorder , the treatment options and the  risks for general health and wellness arising from not treating the condition.   I spent more than 45 minutes of face to face time with the patient.  Greater than 50% of time was  spent in counseling and coordination of care. We have discussed the diagnosis and differential and I answered the patient's questions.     Plan:  Treatment plan and additional workup :   Return for CPAP. Small interface , like a wisp ?  If she cannot tolerate the CPAP, will concentrate on weight loss and dental guard.   Melvyn Novas, MD 09/20/2016, 10:07 AM  Certified in Neurology by ABPN Certified in Sleep Medicine by [facility] [address], [address] [place], [place] [address]

## 2016-09-27 ENCOUNTER — Ambulatory Visit (INDEPENDENT_AMBULATORY_CARE_PROVIDER_SITE_OTHER): Payer: Medicare Other | Admitting: Neurology

## 2016-09-27 DIAGNOSIS — F1120 Opioid dependence, uncomplicated: Secondary | ICD-10-CM

## 2016-09-27 DIAGNOSIS — G4733 Obstructive sleep apnea (adult) (pediatric): Secondary | ICD-10-CM

## 2016-09-27 DIAGNOSIS — G44019 Episodic cluster headache, not intractable: Secondary | ICD-10-CM

## 2016-09-27 DIAGNOSIS — R0683 Snoring: Secondary | ICD-10-CM

## 2016-10-06 NOTE — Procedures (Signed)
PATIENT'S NAME:  Katherine, Pugh DOB:      [birth year]      MR#:    [id number]     DATE OF RECORDING: 09/27/2016 REFERRING [name]:  Katherine Greathouse MD Study Performed:   CPAP  Titration HISTORY:  This patient's Baseline Polysomnogram with capnography on 08/05/2016 resulted in a diagnosis of  mild Obstructive Sleep Apnea (OSA) AHI 12.6/hr. with REM sleep exacerbation to AHI of 55.1/hr. Low oxygen nadir (73%)  and prolonged total desaturation time at or below SpO2 88% of 41 min.  History: Katherine Pugh is seen here after referral from Dr. Lucia Pugh for a sleep evaluation in regards to sleep related headaches. [name] reports that she had once been tested in the sleep  lab in the late 1990s in [place], [place]. At the time she did not receive a call back, no treatment was initiated and she assumed that no abnormalities were found. She had presented to Dr. Lucia Pugh for evaluation of headaches that begun about 14 months ago. She describes her headaches as present when waking up, and as Cluster headaches, waking her in the middle of the night. The patient endorsed the Epworth Sleepiness Scale at 7 points.  The patient's weight 182 pounds with a height of 64 (inches), resulting in a BMI of 31.2 kg/m2.  The patient's neck circumference measured 14.5 inches.  CURRENT MEDICATIONS: Adderall, Aspirin, Benicar HCT, Wellbutrin, Valium, Flonase, Hydrocodone, Claritin, Vimovo, Pataday, Potassium Bicarbonate, Triamterene-HCTZ, Valtrex Ambien    PROCEDURE:  This is a multichannel digital polysomnogram utilizing the SomnoStar 11.2 system.  Electrodes and sensors were applied and monitored per AASM Specifications.   EEG, EOG, Chin and Limb EMG, were sampled at 200 Hz.  ECG, Snore and Nasal Pressure, Thermal Airflow, Respiratory Effort, CPAP Flow and Pressure, Oximetry was sampled at 50 Hz. Digital video and audio were recorded.       CPAP was initiated at 5 cmH20 with heated humidity per AASM split night standards  and pressure was advanced to only 5 cmH20, there was a reduction of the AHI to 0.0 with improvement of obstructive sleep apnea.    Lights Out was at 22:42 and Lights On at 05:06. Total recording time (TRT) was 384 minutes, with a total sleep time (TST) of 290 minutes. The patient's sleep latency was 33.5 minutes with 0 minutes of wake time after sleep onset. REM latency was 82.5 minutes.  The sleep efficiency was 75.5 %.    SLEEP ARCHITECTURE: WASO (Wake after sleep onset) was 73.5 minutes.  There were 11 minutes in Stage N1, 111.5 minutes Stage N2, 108 minutes Stage N3 and 59.5 minutes in Stage REM.  The percentage of Stage N1 was 3.8%, Stage N2 was 38.4%, Stage N3 was 37.2% and Stage R (REM sleep) was 20.5%.   RESPIRATORY ANALYSIS:  There was a total of 0 respiratory events: The patient also had 0 respiratory event related arousals (RERAs).     The total APNEA/HYPOPNEA INDEX (AHI) was 0 /hour and the total RESPIRATORY DISTURBANCE INDEX was 0 hour.  The patient spent 269 minutes of total sleep time in the supine position and 21 minutes in non-supine.  OXYGEN SATURATION & C02:  The baseline 02 saturation was 96%, with the lowest being 88%. Time spent below 89% saturation equaled 1 minute.  PERIODIC LIMB MOVEMENTS:    The patient had a total of 0 Periodic Limb Movements. The arousals were noted as: 16 were spontaneous, 0 were associated with PLMs, and 0 were associated with  respiratory events.  Audio and video analysis did not show any abnormal or unusual movements, behaviors, phonations or vocalizations.  The patient appeared temporarily confused and apprehensive. The patient took one bathroom break, no Snoring was noted. EKG was in keeping with normal sinus rhythm (NSR).   DIAGNOSIS Mild Obstructive Sleep Apnea, responding well to only 5 cm water pressure on nasal pillow interface. The patient was fitted with a ResMed Airfit P10 with small pillows. 1.  No hypoxemia on CPAP, no snoring and only one  bathroom break.    PLANS/RECOMMENDATIONS: I like for this patient to give CPAP a 30 day trial. In case she cannot tolerate CPAP we will move on to Plan B , a dental treatment.   A follow up appointment will be scheduled in the Sleep Clinic at [facility].   Please call [phone] with any questions.      I certify that I have reviewed the entire raw data recording prior to the issuance of this report in accordance with the Standards of Accreditation of the American Academy of Sleep Medicine (AASM)    Melvyn Novas, [name]   10-06-2016  Diplomat, American Board of Psychiatry and Neurology  Diplomat, American Board of Sleep Medicine Medical Director, [place] [facility]

## 2016-10-06 NOTE — Addendum Note (Signed)
Addended by: Melvyn Novas on: 10/06/2016 01:09 PM   Modules accepted: Orders

## 2016-10-07 ENCOUNTER — Encounter: Payer: Self-pay | Admitting: Neurology

## 2016-10-07 ENCOUNTER — Telehealth: Payer: Self-pay

## 2016-10-07 NOTE — Telephone Encounter (Signed)
Left message for patient and let her know that her cpap titration was successful and an order would be sent to a DME company. She will hear from them within a week. She needs to call and schedule a follow-up appointment after 30-90 days on cpap.

## 2016-10-08 NOTE — Telephone Encounter (Signed)
-----   Message from Melvyn Novas, MD sent at 10/06/2016  1:09 PM EDT ----- DIAGNOSIS Mild Obstructive Sleep Apnea, responding well to only 5 cm water  pressure on nasal pillow interface. The patient was fitted with a  ResMed Airfit P10 with small pillows. 1. No hypoxemia on CPAP, no snoring and only one bathroom break.     PLANS/RECOMMENDATIONS: I like for this patient to give CPAP a 30  day trial. In case she cannot tolerate CPAP we will move on to  Plan B , a dental treatment.   A follow up appointment will be scheduled in the Sleep Clinic at  [facility].  Please call [phone] with  any questions.   cc Dr Felipa Eth.

## 2016-10-08 NOTE — Telephone Encounter (Signed)
Sent this information to [employer] along with a note thru My chart informing the patient to call us to schedule her follow up apt. Will await for a call back to schedule her apt.

## 2016-12-27 ENCOUNTER — Telehealth: Payer: Self-pay | Admitting: Neurology

## 2016-12-27 NOTE — Telephone Encounter (Signed)
Called the patient back and made her aware that since she was on a 30 day trial we could get her in sooner. [facility]

## 2016-12-27 NOTE — Telephone Encounter (Signed)
Patient is calling to discuss her CPAP. She started using machine on 12-21-16 and I offered her appointments with Eber Jonesarolyn in January but she thinks she should be seen 4-6 weeks because of insurance. She is not sure about being seen in January and wanted to discuss with you.

## 2017-01-17 ENCOUNTER — Other Ambulatory Visit: Payer: Self-pay | Admitting: Neurology

## 2017-01-17 NOTE — Telephone Encounter (Signed)
Pt returned called and we were able to change her apt to a apt with Dr Vickey Hugerohmeier for 12/21.

## 2017-01-20 ENCOUNTER — Telehealth: Payer: Self-pay | Admitting: Neurology

## 2017-01-20 NOTE — Telephone Encounter (Signed)
Patient called today and stated that the apt that was made on the 21st is intefering with her traveling for christmas. The patient is asking if we can place her on waiting list in case someone cancels or change her apt to later. I informed the patient that with this being a 30 day trial I am unsure how far out this can be pushed out and count with insurance. To be on the safe side I have asked the patient to call [facility] and check with them. In the meantime I have scheduled the patient also for 12/27. Patient will call and cancel one of the apts made once she confirms with [facility].

## 2017-01-27 ENCOUNTER — Ambulatory Visit: Payer: Self-pay

## 2017-02-04 ENCOUNTER — Ambulatory Visit: Payer: Self-pay | Admitting: Neurology

## 2017-02-08 ENCOUNTER — Encounter: Payer: Self-pay | Admitting: Neurology

## 2017-02-10 ENCOUNTER — Encounter: Payer: Self-pay | Admitting: Neurology

## 2017-02-10 ENCOUNTER — Ambulatory Visit: Payer: Medicare Other | Admitting: Neurology

## 2017-02-10 VITALS — BP 137/83 | HR 63 | Ht 64.0 in | Wt 185.0 lb

## 2017-02-10 DIAGNOSIS — G44009 Cluster headache syndrome, unspecified, not intractable: Secondary | ICD-10-CM | POA: Diagnosis not present

## 2017-02-10 DIAGNOSIS — G4733 Obstructive sleep apnea (adult) (pediatric): Secondary | ICD-10-CM

## 2017-02-10 DIAGNOSIS — Z9989 Dependence on other enabling machines and devices: Secondary | ICD-10-CM | POA: Diagnosis not present

## 2017-02-10 DIAGNOSIS — F5104 Psychophysiologic insomnia: Secondary | ICD-10-CM

## 2017-02-10 MED ORDER — ALPRAZOLAM 0.5 MG PO TABS: 0.5000 mg | ORAL_TABLET | Freq: Every evening | ORAL | 0 refills | Status: DC | PRN

## 2017-02-10 NOTE — Progress Notes (Signed)
SLEEP MEDICINE CLINIC   Provider:  Melvyn Novas, M D  Primary Care Physician:  Chilton Greathouse, MD   Referring Provider: Dr. Frances Furbish   Chief Complaint  Patient presents with  . Follow-up    pt here after being on the machine for her 30 day trial.     HPI:  ABBIE Pugh is a 66 y.o. female , seen here as in a referral/ revisit from Dr. Lucia Gaskins for a sleep evaluation in regards to sleep related headaches.   Katherine Pugh reports that she had once been tested in the sleep lab in [place], [place]. At the time she did not receive a call back, no treatment was initiated and she assumed that no abnormalities were found. She had presented to Dr. Lucia Gaskins for evaluation of headaches that begun about 14 months ago. She describes her headaches as waking up with them in the middle of the night they wake her out of sleep. They're severe, sudden and has the character of an attack that feels like a stroke. Her neck would be tense, it happens up to twice a week. It does not happen in the first 2 hours of sleep but more towards the morning hours. The headache is global does not involve one side of the head or 1 region only and can last up to 20 minutes. She avoids movement but having the headaches and had no nausea or vomiting, no light or sound sensitivity associated with it. She sometimes has numbness and left arm pain. About 6 years ago she noted numbness and facial weakness on the left angle of the mouth.   I quote here Dr Trevor Mace note: Assessment/Plan:  Very lovely 65 year old female with new onset headaches after the age of 61, waking her up at night and in the morning, worse with laying down positional. Musculoskeletal neck pain, mild degenerative cervical disease, cervicalgia: Physical therapy and dry needling Morning and nocturnal headaches severe: Sleep eval with Dr. Vickey Huger New onset headache after the age of 4, positional headache(worse laying down), nocturnal headache: Need MRI brain  to evaluate for space occupying lesion or other intracranial etiology, Significant FHx cancer  Chief complaint according to patient : " I need to know if my headaches are related to sleep "  Sleep habits are as follows: She goes to bed between 10 and 11 PM, often experiencing a sleep latency of 30 minutes to an hour. The bedroom is described as cool, quiet and dark. Due to back problems she avoid supine sleep but she also has shoulder pain which makes lateral sleep uncomfortable. She moves a lot. She does report vivid dreams, usually not nightmarish in character. She usually has one bathroom break. She rises around 7 AM. She wakes up spontaneously. She has not had recently severe sleep interrupting headaches but they used to come on in the 3 or 4 AM. She believes that her neck was the cause. No naps in daytime.   Sleep medical history and family sleep history:  Fibromyalgia , hypokalemia, snoring, HTN, enlarged heart/    One biological daughter with aggressive MS diagnosed age 72, now 3 -lives in [place], takes [place]. Other children healthy.   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  Katherine Pugh is a married mother of 3 adult daughters, and has 3 adopted children as well ( 2 nephews adopted after their father's death) . She is expecting her 10th grandchild to be born this August. She  drinks espresso and coffee in the mornings nor soda or ice tea, she is a nontobacco user, she drinks wine and beer- 1-3 a week.  Interview history, from 09/20/2016. Katherine Pugh was referred by Dr. Lucia Gaskins for a sleep evaluation in regards to sleep related headaches. She tested positive for mild OSA with long hypoxemia and REM accentuation to an AHI of 55. We discussed Pugh treatment, including improving nasal patency, she has not taken narcotics in weeks, she wants to loose weight further, and she is claustrophobic- should she go on CPAP is her question. She is not  excessivly daytime sleepy. We agreed to give CPAP a chance, she will prep her nose.   Interval history from 10 February 2017, I meeting Katherine Pugh for a compliance report on her CPAP use.  She has been a highly compliant CPAP user 87% average using the machine for 5 hours and 17 minutes, CPAP is at a low pressure overcoming the patient's hypercapnia is.  She is using 5 cmH2O with EPR and her residual AHI is 0.7.  She does use a nasal pillow irritation at the nostril, was told by [facility] from our care that she will send her a different nasal pillow probably an extra small and a different style.  He still waiting for that of supply.  She is also planning to go to [place] for over 4 weeks and spring like for her to take her CPAP with her have the distilled water purchased a ahead of time I would invite her to investigate if she can actually pack her CPAP or as travel on.    Review of Systems: Out of a complete 14 system review, the patient complains of only the following symptoms, and all other reviewed systems are negative.  Snoring, headaches - Cluster, tension , sinus caused? Marland Kitchen  Nostril irritation . Epworth score 2 from 7 - still low, Fatigue severity score  N/A  , depression score 4/15    Social History   Socioeconomic History  . Marital status: Married    Spouse name: Not on file  . Number of children: 6  . Years of education: 44  . Highest education level: Not on file  Social Needs  . Financial resource strain: Not on file  . Food insecurity - worry: Not on file  . Food insecurity - inability: Not on file  . Transportation needs - medical: Not on file  . Transportation needs - non-medical: Not on file  Occupational History  . Occupation: Unemployed  Tobacco Use  . Smoking status: Former Smoker    Packs/day: 0.50    Types: Cigarettes    Last attempt to quit: 01/04/1974    Years since quitting: 43.1  . Smokeless tobacco: Never Used  Substance and Sexual Activity  .  Alcohol use: Yes    Comment: occasional (1-3 per wk)  . Drug use: No  . Sexual activity: Not on file  Other Topics Concern  . Not on file  Social History Narrative   Lives at home w/ her husband   Right-handed   Caffeine: 2-3 cups of coffee    Family History  Problem Relation Age of Onset  . Stroke Mother   . Lung cancer Father   . Cancer Sister   . Lung cancer Brother        has another brother with lung cancer as well.    . Breast cancer Cousin   . Colon cancer Other  unspecified uncle    Past Medical History:  Diagnosis Date  . Abnormal uterine bleeding   . Chronic back pain   . Fibromyalgia   . Hiatal hernia   . High cholesterol   . Hypertension   . PVC's (premature ventricular contractions)     Past Surgical History:  Procedure Laterality Date  . ABDOMINAL HYSTERECTOMY    . BACK SURGERY    . CESAREAN SECTION     x 3  . DILATION AND CURETTAGE OF UTERUS    . HERNIA REPAIR    . KNEE SURGERY Bilateral    x2  . LAPAROSCOPIC CHOLECYSTECTOMY    . THUMB ARTHROSCOPY      Current Outpatient Medications  Medication Sig Dispense Refill  . aspirin 81 MG tablet Take 81 mg by mouth daily.    Marland Kitchen. BENICAR HCT 40-25 MG per tablet Take 1 tablet by mouth daily.     Marland Kitchen. buPROPion (WELLBUTRIN SR) 100 MG 12 hr tablet Take 100 mg by mouth daily.     . fluticasone (FLONASE) 50 MCG/ACT nasal spray As needed    . PATADAY 0.2 % SOLN As needed    . POTASSIUM BICARBONATE PO Take 20 mEq by mouth 2 (two) times daily.    Marland Kitchen. zolpidem (AMBIEN) 10 MG tablet As needed     No current facility-administered medications for this visit.     Allergies as of 02/10/2017  . (No Known Allergies)    Vitals: BP 137/83   Pulse 63   Ht 5\' 4"  (1.626 m)   Wt 185 lb (83.9 kg)   BMI 31.76 kg/m  Last Weight:  Wt Readings from Last 1 Encounters:  02/10/17 185 lb (83.9 kg)   WUJ:WJXBBMI:Body mass index is 31.76 kg/m.     Last Height:   Ht Readings from Last 1 Encounters:  02/10/17 5\' 4"  (1.626 m)      Physical exam:  General: The patient is awake, alert and appears not in acute distress. The patient is well groomed. Head: Normocephalic, atraumatic. Neck is supple. Mallampati 3,  neck circumference:14.5 . Nasal airflow patent ,  Retrognathia is not seen.  Cardiovascular:  Regular rate and rhythm , without  murmurs or carotid bruit, and without distended neck veins. Respiratory: Lungs are clear to auscultation. Skin:  Without evidence of edema, or rash Trunk: BMI is 31.   Neurologic exam : The patient is awake and alert, oriented to place and time.   Memory subjective described as intact.Attention span & concentration ability appears normal.  Speech is fluent,  without  dysarthria, dysphonia or aphasia.  Mood and affect are appropriate.  Cranial nerves: Pupils are equal and briskly reactive to light. No  nystagmus. Visual fields by finger perimetry are intact. Hearing to finger rub intact. Facial sensation intact to fine touch. Facial motor strength with mild droop on the left , and tongue and uvula move midline. Shoulder shrug was symmetrical.   Motor exam: Normal tone, muscle bulk and symmetric strength in all extremities. Sensory:  Fine touch, pinprick and vibration were tested in all extremities. Proprioception tested in the upper extremities was normal. Coordination: Rapid alternating movements in the fingers/hands was normal.  Gait and station: Patient walks without assistive device . Stance is stable and normal.  Turns with 5 Steps.   Deep tendon reflexes: in the  upper and lower extremities are symmetric and intact. Babinski maneuver response is downgoing.  Assessment:  After physical and neurologic examination, review of laboratory studies,  Personal  review of imaging studies, reports of other /same  Imaging studies, results of polysomnography and / or neurophysiology testing and pre-existing records as far as provided in visit., my assessment is   1) my main assessment  for Pugh will be to evaluate for the cause of cluster headaches. It is interesting that the headaches have ceased since the patient has returned from [place] visiting her [place]. She also did not have any environmental allergies, rhinitis and she ate a very different diet. I think this may be the onset to her headache treatment. She underwent an MRI and she also had no headaches since the scan took place. Also I cannot explain that phenomenon.  2) her husband has told her that she snores but not every night, and mostly when she is very very tired. I would like to evaluate her for possible sleep apnea. Her last sleep test has been almost 2 decades ago.    The patient was advised of the nature of the diagnosed disorder , the treatment options and the  risks for general health and wellness arising from not treating the condition.   I spent more than 20 minutes of face to face time with the patient.  Greater than 50% of time was spent in counseling and coordination of care. We have discussed the diagnosis and differential and I answered the patient's questions.    Plan:  Treatment plan and additional workup :   Continue using  CPAP. Small interface , like a wisp or small nasal pillow- she is doing well and headache improved. Highly anxious, uses Ambien - I recommend to use Xanax - prn at low dose.    Melvyn NovasARMEN Kaelon Weekes, MD 02/10/2017, 7:59 AM  Certified in Neurology by ABPN Certified in Sleep Medicine by [facility]  [facility] [address], [address] [place], [place] [id number]

## 2017-04-07 ENCOUNTER — Encounter: Payer: Self-pay | Admitting: Adult Health

## 2017-05-12 ENCOUNTER — Telehealth: Payer: Self-pay | Admitting: Neurology

## 2017-05-12 DIAGNOSIS — G4733 Obstructive sleep apnea (adult) (pediatric): Secondary | ICD-10-CM

## 2017-05-12 DIAGNOSIS — G4736 Sleep related hypoventilation in conditions classified elsewhere: Secondary | ICD-10-CM

## 2017-05-12 DIAGNOSIS — IMO0002 Reserved for concepts with insufficient information to code with codable children: Secondary | ICD-10-CM

## 2017-05-12 NOTE — Telephone Encounter (Signed)
Pt is asking for a call to discuss placing the order for her CPAP.  Pt states if the call is not until tomorrow please call [phone]

## 2017-05-16 NOTE — Telephone Encounter (Signed)
Pt is calling back asking for a call from RN [place] before the CPAP is ordered.  Please call pt on home#

## 2017-05-16 NOTE — Telephone Encounter (Signed)
Called the patient to let her know that Dr Vickey Hugerohmeier has placed the orders for her CPAP to start. I will send the orders over to [facility] and they will get her started up with her own CPAP. The patient will need a follow up visit 60-90 days after using the machine. Pt can see a NP for this follow up and it would need to be around end of June or by July 1st.  There was no answer, I LVM with this information. If patient calls back please schedule her a follow up apt around that time frame.

## 2017-05-16 NOTE — Telephone Encounter (Signed)
I have called the patient back and she is still very hesitant about starting the CPAP but she knows she feels better when she uses it. She wanted to know what happens if she gets this orders it and finds that she cant use the machine 4 months in due to lack of sleep. I informed her that at that time when she has her follow up apt she can address this concern with Dr Vickey Hugerohmeier and Dr Vickey Hugerohmeier could discuss if there are options that will help her. The patient is willing at this time to continue with CPAP. I have sent the orders over and I have scheduled an apt on August 04, 2017 at 8:30 am with Dr. Vickey Hugerohmeier. Pt verbalized understanding.

## 2017-07-28 ENCOUNTER — Encounter: Payer: Self-pay | Admitting: Neurology

## 2017-08-02 ENCOUNTER — Encounter: Payer: Self-pay | Admitting: Neurology

## 2017-08-04 ENCOUNTER — Encounter: Payer: Self-pay | Admitting: Neurology

## 2017-08-04 ENCOUNTER — Ambulatory Visit: Payer: Medicare Other | Admitting: Neurology

## 2017-08-04 VITALS — BP 132/52 | HR 65 | Ht 64.0 in | Wt 179.0 lb

## 2017-08-04 DIAGNOSIS — Z9989 Dependence on other enabling machines and devices: Secondary | ICD-10-CM | POA: Diagnosis not present

## 2017-08-04 DIAGNOSIS — G4733 Obstructive sleep apnea (adult) (pediatric): Secondary | ICD-10-CM | POA: Insufficient documentation

## 2017-08-04 DIAGNOSIS — G44009 Cluster headache syndrome, unspecified, not intractable: Secondary | ICD-10-CM

## 2017-08-04 HISTORY — DX: Obstructive sleep apnea (adult) (pediatric): G47.33

## 2017-08-04 MED ORDER — ALPRAZOLAM 0.5 MG PO TABS: 0.5000 mg | ORAL_TABLET | Freq: Every evening | ORAL | 0 refills | Status: DC | PRN

## 2017-08-04 NOTE — Progress Notes (Addendum)
SLEEP MEDICINE CLINIC   Provider:  Melvyn Novas, M D  Primary Care Physician:  Chilton Greathouse, MD   Referring Provider: Dr. Lucia Gaskins  Chief Complaint  Patient presents with  . Follow-up    pt alone, rm 10. pt states that she still has a difficult time still with sleeping with the mask. she has tried a couple different mask and thinks she has found the one that works for best for her.     HPI:  Katherine Pugh is a 67 y.o. female, seen here as in a referral/ revisit from Dr. Lucia Gaskins for a sleep evaluation in regards to sleep related headaches.   Katherine Pugh reports that she had once been tested in the sleep lab in [place], [place]. At the time she did not receive a call back, no treatment was initiated and she assumed that no abnormalities were found. She had presented to Dr. Lucia Gaskins for evaluation of headaches that begun about 14 months ago. She describes her headaches as waking up with them in the middle of the night they wake her out of sleep. They're severe, sudden and has the character of an attack that feels like a stroke. Her neck would be tense, it happens up to twice a week. It does not happen in the first 2 hours of sleep but more towards the morning hours. The headache is global does not involve one side of the head or 1 region only and can last up to 20 minutes. She avoids movement but having the headaches and had no nausea or vomiting, no light or sound sensitivity associated with it. She sometimes has numbness and left arm pain. About 6 years ago she noted numbness and facial weakness on the left angle of the mouth.    Chief complaint according to patient : " I need to know if my headaches are related to sleep "  Sleep habits are as follows: She goes to bed between 10 and 11 PM, often experiencing a sleep latency of 30 minutes to an hour. The bedroom is described as cool, quiet and dark. Due to back problems she avoid supine sleep but she also has shoulder pain which  makes lateral sleep uncomfortable. She moves a lot. She does report vivid dreams, usually not nightmarish in character. She usually has one bathroom break. She rises around 7 AM. She wakes up spontaneously. She has not had recently severe sleep interrupting headaches but they used to come on in the 3 or 4 AM. She believes that her neck was the cause. No naps in daytime.   Sleep medical history and family sleep history:  Fibromyalgia , hypokalemia, snoring, HTN, enlarged heart/    One biological daughter with aggressive MS diagnosed age 12, now 83 -lives in [place], takes [place]. Other children healthy.   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  Katherine Pugh is a married mother of 3 adult daughters, and has 3 adopted children as well ( 2 nephews adopted after their father's death) . She is expecting her 10th grandchild to be born this August. She drinks espresso and coffee in the mornings nor soda or ice tea, she is a nontobacco user, she drinks wine and beer- 1-3 a week.  Interview history, from 09/20/2016. Katherine Pugh was referred by Dr. Lucia Gaskins for a sleep evaluation in regards to sleep related headaches. She tested positive for mild OSA with long hypoxemia and REM accentuation to an AHI of  55. We discussed today treatment, including improving nasal patency, she has not taken narcotics in weeks, she wants to loose weight further, and she is claustrophobic- should she go on CPAP is her question. She is not excessivly daytime sleepy. We agreed to give CPAP a chance, she will prep her nose.   Interval history from 10 February 2017, I meeting Katherine Pugh today for a compliance report on her CPAP use.  She has been a highly compliant CPAP user 87% average using the machine for 5 hours and 17 minutes, CPAP is at a low pressure overcoming the patient's hypercapnia.  She is using 5 cmH2O with EPR and her residual AHI is 0.7.  She does use a nasal pillow  irritation at the nostril, was told by Efraim KaufmannMelissa from [employer] that she will send her a different nasal pillow probably an extra small and a different style- e still waiting for supply.  She is also planning to go to [place] for over 4 weeks and spring like for her to take her CPAP with her have the distilled water purchased a ahead of time I would invite her to investigate if she can actually pack her CPAP or as travel on.  08-04-2017 This patient's Baseline Polysomnogram with capnography on 08/05/2016 resulted in a diagnosis of mild Obstructive Sleep  Apnea (OSA) AHI 12.6/hr. with REM sleep exacerbation to AHI of 55.1/hr. Low oxygen nadir (73%) and prolonged total desaturation time below SpO2 89% of 41 min. She was titrated to CPAP and has been very compliant, has noted alertness to have improved, BP is lower, and her headaches are much better.  Review of Katherine Pugh has become a highly compliant CPAP user 100% compliance for the last 30 days prior to 18 June.  Average use of time 6 hours 56 minutes, AutoSet is used between 4 and 10 cmH2O with 3 cm EPR, AHI is 0.8.  This is an excellent resolution very good compliance and her 95th percentile pressure is at 8.3 she does not have air leaks.  She had tried the DreamWear mask but the nasal pillows irritated her cartilage part of the nasal septum.  She is now using a Radiographer, therapeuticisher and paykel ESON XS  Mask.  PS : She encountered many difficulties obtaining distilled water for her CPAP while in [place].   RV next year- 07-2017,     Review of Systems: Out of a complete 14 system review, the patient complains of only the following symptoms, and all other reviewed systems are negative.  Snoring, headaches - Cluster, tension , sinus caused? Marland Kitchen.  Nostril irritation . No more headaches-  Epworth score 2 from 7 - still low, Fatigue severity score  N/A  , depression score 4/15   MRI BRAIN : reviewed images.  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   INTERPRETING PHYSICIAN:  Delia HeadyPRAMOD SETHI, MD  05-28-2016-  Certified in  Neuroimaging by American Society of Neuroimaging and [employer]   Social History   Socioeconomic History  . Marital status: Married    Spouse name: Not on file  . Number of children: 6  . Years of education: 5912  . Highest education level: Not on file  Occupational History  . Occupation: Unemployed  Social Needs  . Financial resource strain: Not on file  . Food insecurity:    Worry: Not on file  Inability: Not on file  . Transportation needs:    Medical: Not on file    Non-medical: Not on file  Tobacco Use  . Smoking status: Former Smoker    Packs/day: 0.50    Types: Cigarettes    Last attempt to quit: 01/04/1974    Years since quitting: 43.6  . Smokeless tobacco: Never Used  Substance and Sexual Activity  . Alcohol use: Yes    Comment: occasional (1-3 per wk)  . Drug use: No  . Sexual activity: Not on file  Lifestyle  . Physical activity:    Days per week: Not on file    Minutes per session: Not on file  . Stress: Not on file  Relationships  . Social connections:    Talks on phone: Not on file    Gets together: Not on file    Attends religious service: Not on file    Active member of club or organization: Not on file    Attends meetings of clubs or organizations: Not on file    Relationship status: Not on file  . Intimate partner violence:    Fear of current or ex partner: Not on file    Emotionally abused: Not on file    Physically abused: Not on file    Forced sexual activity: Not on file  Other Topics Concern  . Not on file  Social History Narrative   Lives at home w/ her husband   Right-handed   Caffeine: 2-3 cups of coffee    Family History  Problem Relation Age of Onset  . Stroke Mother   . Lung cancer Father     . Cancer Sister   . Lung cancer Brother        has another brother with lung cancer as well.    . Breast cancer Cousin   . Colon cancer Other        unspecified uncle    Past Medical History:  Diagnosis Date  . Abnormal uterine bleeding   . Chronic back pain   . Fibromyalgia   . Hiatal hernia   . High cholesterol   . Hypertension   . PVC's (premature ventricular contractions)     Past Surgical History:  Procedure Laterality Date  . ABDOMINAL HYSTERECTOMY    . BACK SURGERY    . CESAREAN SECTION     x 3  . DILATION AND CURETTAGE OF UTERUS    . HERNIA REPAIR    . KNEE SURGERY Bilateral    x2  . LAPAROSCOPIC CHOLECYSTECTOMY    . THUMB ARTHROSCOPY      Current Outpatient Medications  Medication Sig Dispense Refill  . ALPRAZolam (XANAX) 0.5 MG tablet Take 1 tablet (0.5 mg total) by mouth at bedtime as needed for anxiety. 30 tablet 0  . aspirin 81 MG tablet Take 81 mg by mouth daily.    Marland Kitchen BENICAR HCT 40-25 MG per tablet Take 1 tablet by mouth daily.     . Biotin 1000 MCG tablet Take 1,000 mcg by mouth daily.    Marland Kitchen buPROPion (WELLBUTRIN SR) 100 MG 12 hr tablet Take 100 mg by mouth daily.     . Cholecalciferol (VITAMIN D3) 2000 units TABS Take 1 tablet by mouth daily.    . fluticasone (FLONASE) 50 MCG/ACT nasal spray As needed    . PATADAY 0.2 % SOLN As needed    . POTASSIUM BICARBONATE PO Take 10 mEq by mouth 2 (two) times daily.     Marland Kitchen  Pyridoxine HCl (VITAMIN B-6 PO) Take 1 tablet by mouth daily.    Marland Kitchen zolpidem (AMBIEN) 10 MG tablet As needed     No current facility-administered medications for this visit.     Allergies as of 08/04/2017  . (No Known Allergies)    Vitals: BP (!) 132/52   Pulse 65   Ht 5\' 4"  (1.626 m)   Wt 179 lb (81.2 kg)   BMI 30.73 kg/m  Last Weight:  Wt Readings from Last 1 Encounters:  08/04/17 179 lb (81.2 kg)   ZOX:WRUE mass index is 30.73 kg/m.     Last Height:   Ht Readings from Last 1 Encounters:  08/04/17 5\' 4"  (1.626 m)     Physical exam:  General: The patient is awake, alert and appears not in acute distress. The patient is well groomed. Head: Normocephalic, atraumatic. Neck is supple. Mallampati 3,  neck circumference:14.5 . Nasal airflow patent ,  Retrognathia is not seen.  Cardiovascular:  Regular rate and rhythm , without  murmurs or carotid bruit, and without distended neck veins. Respiratory: Lungs are clear to auscultation. Skin:  Without evidence of edema, or rash Trunk: BMI is 31.   Neurologic exam : The patient is awake and alert, oriented to place and time.   Memory subjective described as intact.Attention span & concentration ability appears normal.  Speech is fluent,  without  dysarthria, dysphonia or aphasia.  Mood and affect are appropriate.  Cranial nerves: Pupils are equal and briskly reactive to light. No  nystagmus. Visual fields by finger perimetry are intact. Hearing to finger rub intact. Facial sensation intact to fine touch. Facial motor strength with mild droop on the left , and tongue and uvula move midline. Shoulder shrug was symmetrical.   Motor exam: Normal tone, muscle bulk and symmetric strength in all extremities. Sensory:  Fine touch, pinprick and vibration were normal. Coordination: Rapid alternating movements in the fingers/hands was normal.  Assessment:  After physical and neurologic examination, review of laboratory studies,  Personal review of imaging studies, reports of other /same  Imaging studies, results of polysomnography and / or neurophysiology testing and pre-existing records as far as provided in visit., my assessment is   1) resolution of  cluster headaches while on CPAP !    The patient was advised of the nature of the diagnosed disorder , the treatment options and the  risks for general health and wellness arising from not treating the condition.   I spent more than 20 minutes of face to face time with the patient.  Greater than 50% of time was spent  in counseling and coordination of care. We have discussed the diagnosis and differential and I answered the patient's questions.    Plan:  Treatment plan and additional workup :   Continue using  CPAP. Small interface , like a wisp or small nasal pillow- she is doing well and headache improved. Highly anxious, uses Ambien - I recommend to use Xanax - prn at low dose.    Melvyn Novas, MD 08/04/2017, 8:42 AM  Certified in Neurology by ABPN Certified in Sleep Medicine by [facility] [address], [address] [place], [place] [address]

## 2017-08-11 ENCOUNTER — Ambulatory Visit: Payer: Medicare Other | Admitting: Adult Health

## 2017-08-15 ENCOUNTER — Ambulatory Visit: Payer: Medicare Other | Admitting: Adult Health

## 2018-07-27 ENCOUNTER — Telehealth: Payer: Self-pay | Admitting: Neurology

## 2018-07-27 NOTE — Telephone Encounter (Signed)
Called the patient to inform her that Dr Brett Fairy is on vacation the week of June 22. Advised that she has opt'd to complete Video visits in the afternoon and would like to offer to push her apt into the afternoon hours for her. If pt would like to come in we will have to move her with a NP. This visit was a yearly CPAP follow up so as long as we get her in with a NP that would be fine.

## 2018-07-31 ENCOUNTER — Telehealth: Payer: Self-pay | Admitting: Neurology

## 2018-07-31 NOTE — Telephone Encounter (Signed)
Due to current COVID 19 pandemic, our office is severely reducing in office visits until further notice, in order to minimize the risk to our patients and healthcare providers.   Called patient and offered a virtual visit for her appt. Patient was scheduled for 6/22 but after agreeing to a virtual visit she was rescheduled to 6/23 due to Dr. Brett Fairy schedule template change. I spent 15 minutes on the phone with patient to help her with MyChart. Patient verbalized understanding and I told her to call us if she has any questions.  Pt understands that although there may be some limitations with this type of visit, we will take all precautions to reduce any security or privacy concerns.  Pt understands that this will be treated like an in office visit and we will file with pt's insurance, and there may be a patient responsible charge related to this service.

## 2018-08-02 ENCOUNTER — Encounter: Payer: Self-pay | Admitting: Neurology

## 2018-08-02 NOTE — Telephone Encounter (Signed)
Called the patient to review their chart and made sure that everything was up to date. Patient informed they received the e-mail/text message for the visit. Instructed to make sure they hold on to the e-mail/text for the upcoming appointment as it is necessary to access their appointment. Instructed the patient that apx 30 min prior to the appointment the front staff will contact them to make sure they are ready to go for their appointment in case there is any need for troubleshooting it can be completed prior to the appointment time. Reminded the patient once more that this is treated as a Office visit and the patient must be prepared for the visit and ready at the time of their appointment preferably in a well lit area where they have good connection for the visit. Pt verbalized understanding.   Pt did receive a [employer].

## 2018-08-02 NOTE — Addendum Note (Signed)
Addended by: Darleen Crocker on: 08/02/2018 04:18 PM   Modules accepted: Orders

## 2018-08-07 ENCOUNTER — Ambulatory Visit: Payer: Medicare Other | Admitting: Neurology

## 2018-08-08 ENCOUNTER — Telehealth (INDEPENDENT_AMBULATORY_CARE_PROVIDER_SITE_OTHER): Payer: Medicare Other | Admitting: Neurology

## 2018-08-08 DIAGNOSIS — Z9989 Dependence on other enabling machines and devices: Secondary | ICD-10-CM

## 2018-08-08 DIAGNOSIS — G44009 Cluster headache syndrome, unspecified, not intractable: Secondary | ICD-10-CM | POA: Diagnosis not present

## 2018-08-08 DIAGNOSIS — G4736 Sleep related hypoventilation in conditions classified elsewhere: Secondary | ICD-10-CM

## 2018-08-08 DIAGNOSIS — G4733 Obstructive sleep apnea (adult) (pediatric): Secondary | ICD-10-CM

## 2018-08-08 DIAGNOSIS — IMO0002 Reserved for concepts with insufficient information to code with codable children: Secondary | ICD-10-CM

## 2018-08-08 NOTE — Progress Notes (Signed)
SLEEP MEDICINE CLINIC   Provider:  Melvyn Novasarmen  Jerrion Tabbert, M D  Primary Care Physician:  Chilton GreathouseAvva, Ravisankar, MD   Referring Provider: Dr. Lucia GaskinsAhern   Virtual Visit via Video Note  I connected with Katherine Johnsonna M Pugh on 08/08/18 at  3:00 PM EDT by a video enabled telemedicine application and verified that I am speaking with the correct person using two identifiers.  Location: Patient: at her home  Provider: at [facility]   I discussed the limitations of evaluation and management by telemedicine and the availability of in person appointments. The patient expressed understanding and agreed to proceed.   Interval history : Virtual visit from 08-08-2018,  Katherine Pugh has been doing very well with CPAP use, she is using an air sense 10 AutoSet between 4 cm water pressure and 10 cm water pressure with 3 cm EPR, she has a compliance of 97% by time and 100% by days.  With an average of 6 hours and 48 minutes nightly.  The 95th percentile pressure is 8.9 cm water, air leak 95th percentile is 2.8 L/min which is a dream.  Residual AHI is 1.1/h and no central apneas are emerging.  This is an excellent resolution.   The best thing however is the patient no longer has cluster headaches since she is using CPAP compliantly.   Sometimes she still struggles with insomnia and she has been prescribed by Dr. hour 10 mg tablets of Ambien of which she takes a half or the equivalent of 5 mg on average 3 nights per week.  I think this is a judicial use of Ambien.   Her Epworth Sleepiness Scale was endorsed at 5 out of 24 points.    She is practicing social distancing which is easy as she lives [place] an [address] [place] in [place].  Of course she is bothered by not seeing her grandchildren of which she has 10.      History of Present Illness:  Katherine Pugh is a 68 y.o. female, was seen in a referralit from Dr. Lucia GaskinsAhern for a sleep evaluation in regards to sleep related headaches.   Katherine Pugh reports that she had once been tested in  [place] lab [place], [place]. At the time she did not receive a call back, no treatment was initiated and she assumed that no abnormalities were found. She had presented to Dr. Lucia GaskinsAhern for evaluation of headaches that begun about 14 months ago. She describes her headaches as waking up with them in the middle of the night they wake her out of sleep. They're severe, sudden and has the character of an attack that feels like a stroke. Her neck would be tense, it happens up to twice a week. It does not happen in the first 2 hours of sleep but more towards the morning hours. The headache is global does not involve one side of the head or 1 region only and can last up to 20 minutes. She avoids movement but having the headaches and had no nausea or vomiting, no light or sound sensitivity associated with it. She sometimes has numbness and left arm pain. About 6 years ago she noted numbness and facial weakness on the left angle of the mouth.    Chief complaint according to patient : " I need to know if my headaches are related to sleep "  Sleep habits are as follows: She goes to bed between 10 and 11 PM, often experiencing a sleep latency of 30 minutes to an  hour. The bedroom is described as cool, quiet and dark. Due to back problems she avoid supine sleep but she also has shoulder pain which makes lateral sleep uncomfortable. She moves a lot. She does report vivid dreams, usually not nightmarish in character. She usually has one bathroom break. She rises around 7 AM. She wakes up spontaneously. She has not had recently severe sleep interrupting headaches but they used to come on in the 3 or 4 AM. She believes that her neck was the cause. No naps in daytime.   Sleep medical history and family sleep history:  Fibromyalgia , hypokalemia, snoring, HTN, enlarged heart/    One biological daughter with aggressive MS diagnosed age 64, now 75 -lives in [place], takes Gilenya. Other children healthy.   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  Katherine Pugh is a married mother of 3 adult daughters, and has 3 adopted children as well ( 2 nephews adopted after their father's death) . She is expecting her 10th grandchild to be born this August. She drinks espresso and coffee in the mornings nor soda or ice tea, she is a nontobacco user, she drinks wine and beer- 1-3 a week.  Interview history, from 09/20/2016. Katherine Pugh was referred by Dr. Lucia Gaskins for a sleep evaluation in regards to sleep related headaches. She tested positive for mild OSA with long hypoxemia and REM accentuation to an AHI of 55. We discussed today treatment, including improving nasal patency, she has not taken narcotics in weeks, she wants to loose weight further, and she is claustrophobic- should she go on CPAP is her question. She is not excessivly daytime sleepy. We agreed to give CPAP a chance, she Pugh prep her nose.   Interval history from 10 February 2017, I meeting Katherine Pugh today for a compliance report on her CPAP use.  She has been a highly compliant CPAP user 87% average using the machine for 5 hours and 17 minutes, CPAP is at a low pressure overcoming the patient's hypercapnia.  She is using 5 cmH2O with EPR and her residual AHI is 0.7.  She does use a nasal pillow irritation at the nostril, was told by Efraim Kaufmann from [employer] that she Pugh send her a different nasal pillow probably an extra small and a different style- e still waiting for supply.  She is also planning to go to [place] for over 4 weeks and spring like for her to take her CPAP with her have the distilled water purchased a ahead of time I would invite her to investigate if she can actually pack her CPAP or as travel on.  08-04-2017 This patient's Baseline Polysomnogram with capnography on 08/05/2016 resulted in a diagnosis of mild Obstructive Sleep  Apnea (OSA) AHI 12.6/hr. with REM sleep exacerbation to AHI of  55.1/hr. Low oxygen nadir (73%) and prolonged total desaturation time below SpO2 89% of 41 min. She was titrated to CPAP and has been very compliant, has noted alertness to have improved, BP is lower, and her headaches are much better.  Review of Katherine Pugh has become a highly compliant CPAP user 100% compliance for the last 30 days prior to 18 June.  Average use of time 6 hours 56 minutes, AutoSet is used between 4 and 10 cmH2O with 3 cm EPR, AHI is 0.8.  This is an excellent resolution very good compliance and her 95th percentile pressure is at 8.3 she does not have air leaks.  She had tried the DreamWear mask but the nasal pillows irritated her cartilage part of the nasal septum.  She is now using a Radiographer, therapeuticisher and paykel ESON XS  Mask.  PS : She encountered many difficulties obtaining distilled water for her CPAP [place]   RV next year- 07-2017,     Review of Systems: Out of a complete 14 system review, the patient complains of only the following symptoms, and all other reviewed systems are negative.  Epworth score 5 /24 -    Social History   Socioeconomic History  . Marital status: Married    Spouse name: Not on file  . Number of children: 6  . Years of education: 812  . Highest education level: Not on file  Occupational History  . Occupation: Unemployed  Social Needs  . Financial resource strain: Not on file  . Food insecurity    Worry: Not on file    Inability: Not on file  . Transportation needs    Medical: Not on file    Non-medical: Not on file  Tobacco Use  . Smoking status: Former Smoker    Packs/day: 0.50    Types: Cigarettes    Quit date: 01/04/1974    Years since quitting: 44.6  . Smokeless tobacco: Never Used  Substance and Sexual Activity  . Alcohol use: Yes    Comment: occasional (1-3 per wk)  . Drug use: No  . Sexual activity: Not on file  Lifestyle  . Physical activity    Days per week: Not on file    Minutes per session: Not on  file  . Stress: Not on file  Relationships  . Social Musicianconnections    Talks on phone: Not on file    Gets together: Not on file    Attends religious service: Not on file    Active member of club or organization: Not on file    Attends meetings of clubs or organizations: Not on file    Relationship status: Not on file  . Intimate partner violence    Fear of current or ex partner: Not on file    Emotionally abused: Not on file    Physically abused: Not on file    Forced sexual activity: Not on file  Other Topics Concern  . Not on file  Social History Narrative   Lives at home w/ her husband   Right-handed   Caffeine: 2-3 cups of coffee    Family History  Problem Relation Age of Onset  . Stroke Mother   . Lung cancer Father   . Cancer Sister   . Lung cancer Brother        has another brother with lung cancer as well.    . Breast cancer Cousin   . Colon cancer Other        unspecified uncle    Past Medical History:  Diagnosis Date  . Abnormal uterine bleeding   . Chronic back pain   . Fibromyalgia   . Hiatal hernia   . High cholesterol   . Hypertension   . PVC's (premature ventricular contractions)     Past Surgical History:  Procedure Laterality Date  . ABDOMINAL HYSTERECTOMY    . BACK SURGERY    . CESAREAN SECTION     x 3  . DILATION AND CURETTAGE OF UTERUS    . HERNIA REPAIR    . KNEE SURGERY Bilateral    x2  . LAPAROSCOPIC CHOLECYSTECTOMY    .  THUMB ARTHROSCOPY      Current Outpatient Medications  Medication Sig Dispense Refill  . ALPRAZolam (XANAX) 0.5 MG tablet Take 1 tablet (0.5 mg total) by mouth at bedtime as needed for anxiety. (Patient not taking: Reported on 08/02/2018) 30 tablet 0  . aspirin 81 MG tablet Take 81 mg by mouth daily.    Marland Kitchen BENICAR HCT 40-25 MG per tablet Take 1 tablet by mouth daily.     . Biotin 1000 MCG tablet Take 1,000 mcg by mouth daily.    . fluticasone (FLONASE) 50 MCG/ACT nasal spray Place 2 sprays into both nostrils daily.  As needed    . loratadine (CLARITIN) 10 MG tablet Take 10 mg by mouth daily.    Marland Kitchen PATADAY 0.2 % SOLN daily as needed. As needed    . POTASSIUM BICARBONATE PO Take 10-20 mEq by mouth 2 (two) times daily.     Marland Kitchen zolpidem (AMBIEN) 10 MG tablet As needed     No current facility-administered medications for this visit.     Allergies as of 08/08/2018  . (No Known Allergies)     Observations/Objective:    There were no vitals taken for this visit. Last Weight:  Wt Readings from Last 1 Encounters:  08/04/17 179 lb (81.2 kg)   JME:QASTM is no height or weight on file to calculate BMI.     Last Height:   Ht Readings from Last 1 Encounters:  08/04/17 5\' 4"  (1.626 m)    OBSERVATION.   General: The patient is awake, alert and appears not in acute distress. The patient is well groomed.  Neurologic exam : The patient is awake and alert, oriented to place and time.   Memory subjective described as intact.Attention span & concentration ability appears normal.  Speech is fluent,  without  dysarthria, dysphonia or aphasia.  Mood and affect are appropriate.  Cranial nerves: Pupils are equal , Facial motor strength with mild droop on the left , and tongue and uvula move midline. Shoulder shrug was symmetrical.   Motor exam: Normal ROM  and symmetric strength in all extremities. Coordination: Rapid alternating movements in the fingers/hands without tremor.   Assessment and Plan: We briefly discussed several issues #1 she feels that the headgear is sometimes untangling her long hair.  She is using an ex as extra small size of nasal pillows and not all many factors of her extra small nostril pillows.  I recommended to look into the [facility] Swift mask as it interface allows ear loops as well as headgear and ear loops would certainly be less interfering with long hair.  I congratulated her to her compliance which is excellent and I am happy that her cluster headaches have resolved.  She no longer is  snoring and her Ambien use is judicial in my book. She can adjust the humidifier settings herself.     Follow Up Instructions: We Pugh follow-up in a year.  She may see the nurse practitioner alternating with me.    I discussed the assessment and treatment plan with the patient. The patient was provided an opportunity to ask questions and all were answered. The patient agreed with the plan and demonstrated an understanding of the instructions.   The patient was advised to call back or seek an in-person evaluation if the symptoms worsen or if the condition fails to improve as anticipated.  I provided 18 minutes of non-face-to-face time during this encounter.   Larey Seat, MD  Larey Seat, MD 08/08/2018, 4:10  PM  Certified in Neurology by ABPN Certified in Sleep Medicine by [facility]  [facility] [address], [address] [place], [place] [id number]

## 2018-10-26 ENCOUNTER — Telehealth: Payer: Self-pay

## 2018-10-26 NOTE — Telephone Encounter (Signed)
NOTES ON FILE FROM [facility] [phone], SENT REFERRAL TO SCHEDULING 

## 2018-10-26 NOTE — Telephone Encounter (Signed)
ERROR ON FIRST NOTE. NOTES ON FILE FROM DR AVVA [phone],GKKD REFERRAL TO SCHEDULING

## 2018-12-05 ENCOUNTER — Ambulatory Visit: Payer: Medicare Other | Admitting: Internal Medicine

## 2018-12-05 ENCOUNTER — Other Ambulatory Visit: Payer: Self-pay

## 2018-12-05 ENCOUNTER — Encounter: Payer: Self-pay | Admitting: Internal Medicine

## 2018-12-05 VITALS — BP 167/82 | HR 68 | Ht 64.0 in | Wt 188.4 lb

## 2018-12-05 DIAGNOSIS — R609 Edema, unspecified: Secondary | ICD-10-CM

## 2018-12-05 DIAGNOSIS — I10 Essential (primary) hypertension: Secondary | ICD-10-CM

## 2018-12-05 DIAGNOSIS — R002 Palpitations: Secondary | ICD-10-CM

## 2018-12-05 DIAGNOSIS — R0600 Dyspnea, unspecified: Secondary | ICD-10-CM | POA: Diagnosis not present

## 2018-12-05 NOTE — Progress Notes (Signed)
ELECTROPHYSIOLOGY CONSULT NOTE  Patient ID: Katherine Pugh, MRN: [id number], DOB/AGE: 66-29-1952 66 y.o. Admit date: (Not on file) Date of Consult: 12/05/2018  Primary Physician: [name], MD Primary Cardiologist:       [name] is a 68 y.o. female who is being seen today for the evaluation of bradycardia at the request of Dr Dagmar Hait.    HPI Katherine Pugh is a 68 y.o. 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  No prior DVT but Fhx of DVT   No known CAD  Recently had weakness and fatigue and noted her HR was about 40  Spoke to HM who arranged her for the visit         Past Medical History:  Diagnosis Date  . Abnormal uterine bleeding   . Atypical chest pain 01/05/2011  . Chronic back pain   . Episodic cluster headache, not intractable 07/21/2016  . Fibromyalgia   . Hiatal hernia   . High cholesterol   . Hypertension   . Leg edema-left 01/05/2011  . Myalgia 07/21/2016  . OSA on CPAP 08/04/2017  . Palpitations-nocturnal 01/05/2011  . PVC's (premature ventricular contractions)   . Vasomotor rhinitis 07/21/2016      Surgical History:  Past Surgical History:  Procedure Laterality Date  . ABDOMINAL HYSTERECTOMY    . BACK SURGERY    . CESAREAN SECTION     x 3  . DILATION AND CURETTAGE OF UTERUS    . HERNIA REPAIR    . KNEE SURGERY Bilateral    x2  . LAPAROSCOPIC CHOLECYSTECTOMY    . THUMB ARTHROSCOPY       Home Meds: Current Meds  Medication Sig  . aspirin 81 MG tablet Take 81 mg by mouth daily.  Marland Kitchen BENICAR HCT 40-25 MG per tablet Take 1 tablet by mouth daily.   . Biotin 1000 MCG tablet Take 1,000 mcg by mouth daily.  . fluticasone (FLONASE) 50 MCG/ACT nasal spray Place 2 sprays into both nostrils daily. As needed  . loratadine (CLARITIN) 10 MG  tablet Take 10 mg by mouth daily.  Marland Kitchen PATADAY 0.2 % SOLN daily as needed. As needed  . potassium chloride (KLOR-CON) 10 MEQ tablet Take 20 mEq by mouth 2 (two) times daily.  Marland Kitchen zolpidem (AMBIEN) 10 MG tablet As needed    Allergies: No Known Allergies  Social History   Socioeconomic History  . Marital status: Married    Spouse name: Not on file  . Number of children: 6  . Years of education: 25  . Highest education level: Not on file  Occupational History  . Occupation: Unemployed  Social Needs  . Financial resource strain: Not on file  . Food insecurity    Worry: Not on file    Inability: Not on file  . Transportation needs    Medical: Not on file    Non-medical: Not on file  Tobacco Use  . Smoking status: Former Smoker    Packs/day: 0.50    Types: Cigarettes    Quit date: 01/04/1974    Years since quitting: 44.9  . Smokeless tobacco: Never Used  Substance and Sexual Activity  . Alcohol use: Yes    Comment: occasional (1-3 per wk)  . Drug use: No  .  Sexual activity: Not on file  Lifestyle  . Physical activity    Days per week: Not on file    Minutes per session: Not on file  . Stress: Not on file  Relationships  . Social Musicianconnections    Talks on phone: Not on file    Gets together: Not on file    Attends religious service: Not on file    Active member of club or organization: Not on file    Attends meetings of clubs or organizations: Not on file    Relationship status: Not on file  . Intimate partner violence    Fear of current or ex partner: Not on file    Emotionally abused: Not on file    Physically abused: Not on file    Forced sexual activity: Not on file  Other Topics Concern  . Not on file  Social History Narrative   Lives at home w/ her husband   Right-handed   Caffeine: 2-3 cups of coffee     Family History  Problem Relation Age of Onset  . Stroke Mother   . Lung cancer Father   . Cancer Sister   . Lung cancer Brother        has another  brother with lung cancer as well.    . Breast cancer Cousin   . Colon cancer Other        unspecified uncle     ROS:  Please see the history of present illness.   All other systems reviewed and negative.    Physical Exam  Blood pressure (!) 167/82, pulse 68, height 5\' 4"  (1.626 m), weight 188 lb 6.4 oz (85.5 kg), SpO2 98 %. General: Well developed, well nourished female in no acute distress. Head: Normocephalic, atraumatic, sclera non-icteric, no xanthomas, nares are without discharge. EENT: normal  Lymph Nodes:  none Neck: Negative for carotid bruits. JVD not elevated. Back:without scoliosis kyphosis*  Lungs: Clear bilaterally to auscultation without wheezes, rales, or rhonchi. Breathing is unlabored. Heart: RRR with S1 S2. No murmur . No rubs, or gallops appreciated. Abdomen: Soft, non-tender, non-distended with normoactive bowel sounds. No hepatomegaly. No rebound/guarding. No obvious abdominal masses. Msk:  Strength and tone appear normal for age. Extremities: No clubbing or cyanosis. No  edema.  Distal pedal pulses are 2+ and equal bilaterally. Skin: Warm and Dry Neuro: Alert and oriented X 3. CN III-XII intact Grossly normal sensory and motor function . Psych:  Responds to questions appropriately with a normal affect.      Labs: Cardiac Enzymes No results for input(s): CKTOTAL, CKMB, TROPONINI in the last 72 hours. CBC Lab Results  Component Value Date   WBC 12.1 (H) 12/25/2009   HGB 14.0 12/25/2009   HCT 39.9 12/25/2009   MCV 90.7 12/25/2009   PLT 160 12/25/2009   PROTIME: No results for input(s): LABPROT, INR in the last 72 hours. Chemistry No results for input(s): NA, K, CL, CO2, BUN, CREATININE, CALCIUM, PROT, BILITOT, ALKPHOS, ALT, AST, GLUCOSE in the last 168 hours.  Invalid input(s): LABALBU Lipids No results found for: CHOL, HDL, LDLCALC, TRIG BNP No results found for: PROBNP Thyroid Function Tests: No results for input(s): TSH, T4TOTAL, T3FREE,  THYROIDAB in the last 72 hours.  Invalid input(s): FREET3 Miscellaneous Lab Results  Component Value Date   DDIMER 0.30 05/07/2011    Radiology/Studies:  No results found.  EKG: Sinus at 70 Intervals 13/08/41 Axis 46 Otherwise normal.*   Assessment and Plan:   Atypical chest pain  LUE swelling assoc with dyspnea  FHx of clotting   Bradycardia   Hypertension   Prior history of PVCs and functional bradycardia raises the possibility of bigeminy causing "pseudobradycardia "   I think an AliveCor monitor becomes the most expeditious and efficient tool for clarify the mechanism of her bradycardia   Her blood pressure remains poorly controlled but for now we will hold off on a third antihypertensive in 2 weeks understand with her bradycardia is real or pseudo-.  I am particularly concerned about the swelling in her left arm giving the family history of clotting and its association with shortness of breath.  We will undertake a venous Doppler.  It may or may not be illuminating.  Often venography is necessary.  We will also undertake a cardiac ultrasound to look for right ventricular size and function to inform the possibility.  I will reach out to Dr. Felipa Eth as to whether she should have an evaluation for hyper coagulable disorder.    Sherryl Manges  called during the conversation

## 2018-12-05 NOTE — Patient Instructions (Signed)
Medication Instructions:  Your physician recommends that you continue on your current medications as directed. Please refer to the Current Medication list given to you today.  Labwork: None ordered.  Testing/Procedures: Your physician has requested that you have an echocardiogram. Echocardiography is a painless test that uses sound waves to create images of your heart. It provides your doctor with information about the size and shape of your heart and how well your heart's chambers and valves are working. This procedure takes approximately one hour. There are no restrictions for this procedure.  Doppler of left upper extremity   Follow-Up: Your physician recommends that you schedule a follow-up appointment in:   One year with Dr. Caryl Comes in [facility]  Any Other Special Instructions Will Be Listed Below (If Applicable).     If you need a refill on your cardiac medications before your next appointment, please call your pharmacy.

## 2018-12-12 ENCOUNTER — Ambulatory Visit (HOSPITAL_COMMUNITY): Payer: Medicare Other | Attending: Cardiology

## 2018-12-12 ENCOUNTER — Other Ambulatory Visit (HOSPITAL_COMMUNITY): Payer: Medicare Other

## 2018-12-12 ENCOUNTER — Other Ambulatory Visit: Payer: Self-pay

## 2018-12-12 DIAGNOSIS — I10 Essential (primary) hypertension: Secondary | ICD-10-CM | POA: Diagnosis present

## 2018-12-12 DIAGNOSIS — R002 Palpitations: Secondary | ICD-10-CM | POA: Insufficient documentation

## 2018-12-12 DIAGNOSIS — R0600 Dyspnea, unspecified: Secondary | ICD-10-CM | POA: Insufficient documentation

## 2018-12-13 ENCOUNTER — Ambulatory Visit (HOSPITAL_COMMUNITY)
Admission: RE | Admit: 2018-12-13 | Discharge: 2018-12-13 | Disposition: A | Discharge status: Home / Self Care | Payer: Medicare Other | Source: Ambulatory Visit | Attending: Cardiology | Admitting: Cardiology

## 2018-12-13 DIAGNOSIS — R609 Edema, unspecified: Secondary | ICD-10-CM | POA: Insufficient documentation

## 2018-12-14 ENCOUNTER — Encounter (HOSPITAL_COMMUNITY): Payer: Medicare Other

## 2018-12-20 DIAGNOSIS — Z01810 Encounter for preprocedural cardiovascular examination: Secondary | ICD-10-CM

## 2018-12-20 DIAGNOSIS — R601 Generalized edema: Secondary | ICD-10-CM

## 2018-12-29 NOTE — Telephone Encounter (Signed)
-----   Message from Deboraha Sprang, MD sent at 12/25/2018  5:48 PM EST ----- Please Inform Patient Echo showed  Normal  heart muscle function but with mildly elevated Pulm Pressure   If her L arnm is still swollen we should do a venogram as the doppleer was negative but it can be so even with a clot How is her dyspnea

## 2018-12-29 NOTE — Telephone Encounter (Signed)
Spoke with pt and reviewed doppler and echo results. She has agreed to have a venogram performed to address her LUE edema. She states her BP has been ranging in the 130's but has not measured regularly. She has been advised to measure a few hours after taking her BP meds to monitor.   She will await a call from scheduling for her venogram study.

## 2019-01-03 ENCOUNTER — Other Ambulatory Visit: Payer: Self-pay

## 2019-01-03 ENCOUNTER — Other Ambulatory Visit: Payer: Medicare Other | Admitting: *Deleted

## 2019-01-03 DIAGNOSIS — Z01810 Encounter for preprocedural cardiovascular examination: Secondary | ICD-10-CM

## 2019-01-03 DIAGNOSIS — R601 Generalized edema: Secondary | ICD-10-CM

## 2019-01-03 NOTE — Telephone Encounter (Signed)
Spoke with the pt and Stacy/CT and pt to come this afternoon before 5 pm for pre CT BMET and to stop solid foods after 12 noon tomorrow for her 2:15pm CT.. she will drink at least 64 ounces of fluids tomorrow prior to her arrival.       COVID-19 Pre-Screening Questions:  . In the past 7 to 10 days have you had a cough,  shortness of breath, headache, congestion, fever (100 or greater) body aches, chills, sore throat, or sudden loss of taste or sense of smell? NO . Have you been around anyone with known Covid 19. NO . Have you been around anyone who is awaiting Covid 19 test results in the past 7 to 10 days? NO . Have you been around anyone who has been exposed to Covid 19, or has mentioned symptoms of Covid 19 within the past 7 to 10 days? NO  If you have any concerns/questions about symptoms patients report during screening (either on the phone or at threshold). Contact the provider seeing the patient or DOD for further guidance.  If neither are available contact a member of the leadership team.

## 2019-01-03 NOTE — Telephone Encounter (Signed)
Spoke with the Dr. Caryl Comes and he would like to continue with the order for the Venogram even though the Radiologist had suggested may not be necessary since pt had an ultrasound. Stacy with CT advised that pt still needs it. Pt requests to have done asap prior to an appt with her Orthopaedic MD on Monday 01/08/19... per Marzetta Board may be able to work her in Thursday 01/04/19. Will talk with the pt again about her availability for then.

## 2019-01-04 ENCOUNTER — Ambulatory Visit (INDEPENDENT_AMBULATORY_CARE_PROVIDER_SITE_OTHER)
Admission: RE | Admit: 2019-01-04 | Discharge: 2019-01-04 | Disposition: A | Discharge status: Home / Self Care | Payer: Medicare Other | Source: Ambulatory Visit | Attending: Internal Medicine | Admitting: Internal Medicine

## 2019-01-04 DIAGNOSIS — R601 Generalized edema: Secondary | ICD-10-CM | POA: Diagnosis not present

## 2019-01-04 LAB — BASIC METABOLIC PANEL
BUN/Creatinine Ratio: 19 (ref 12–28)
BUN: 20 mg/dL (ref 8–27)
CO2: 26 mmol/L (ref 20–29)
Calcium: 9.3 mg/dL (ref 8.7–10.3)
Chloride: 102 mmol/L (ref 96–106)
Creatinine, Ser: 1.07 mg/dL — ABNORMAL HIGH (ref 0.57–1.00)
GFR calc Af Amer: 63 mL/min/{1.73_m2} (ref >59)
GFR calc non Af Amer: 54 mL/min/{1.73_m2} — ABNORMAL LOW (ref >59)
Glucose: 100 mg/dL — ABNORMAL HIGH (ref 65–99)
Potassium: 3.9 mmol/L (ref 3.5–5.2)
Sodium: 142 mmol/L (ref 134–144)

## 2019-01-04 MED ORDER — IOHEXOL 300 MG/ML  SOLN
80.0000 mL | Freq: Once | INTRAMUSCULAR | Status: AC | PRN
  Administered 2019-01-04: 80 mL via INTRAVENOUS

## 2019-03-07 ENCOUNTER — Ambulatory Visit: Payer: Medicare Other

## 2019-03-10 ENCOUNTER — Ambulatory Visit: Payer: Medicare Other | Attending: Internal Medicine

## 2019-03-10 DIAGNOSIS — Z23 Encounter for immunization: Secondary | ICD-10-CM

## 2019-03-10 NOTE — Progress Notes (Signed)
   Covid-19 Vaccination Clinic  Name:  Katherine Pugh    MRN: [id number] DOB: [birth year]  03/10/2019  Katherine Pugh was observed post Covid-19 immunization for 15 minutes without incidence. She was provided with Vaccine Information Sheet and instruction to access the V-Safe system.   Katherine Pugh was instructed to call 911 with any severe reactions post vaccine: Marland Kitchen Difficulty breathing  . Swelling of your face and throat  . A fast heartbeat  . A bad rash all over your body  . Dizziness and weakness    Immunizations Administered    Name Date Dose VIS Date Route   Pfizer COVID-19 Vaccine 03/10/2019  1:10 PM 0.3 mL 01/26/2019 Intramuscular   Manufacturer: [employer], [employer]   Lot: [id number]   NDC: [address]-4

## 2019-03-31 ENCOUNTER — Ambulatory Visit: Payer: Medicare Other | Attending: Internal Medicine

## 2019-03-31 DIAGNOSIS — Z23 Encounter for immunization: Secondary | ICD-10-CM | POA: Insufficient documentation

## 2019-03-31 NOTE — Progress Notes (Signed)
   Covid-19 Vaccination Clinic  Name:  CHARDONAY SCRITCHFIELD    MRN: [id number] DOB: Aug 18, 1950  03/31/2019  Ms. Schwebke was observed post Covid-19 immunization for 15 minutes without incidence. She was provided with Vaccine Information Sheet and instruction to access the V-Safe system.   Ms. Vogan was instructed to call 911 with any severe reactions post vaccine: Marland Kitchen Difficulty breathing  . Swelling of your face and throat  . A fast heartbeat  . A bad rash all over your body  . Dizziness and weakness    Immunizations Administered    Name Date Dose VIS Date Route   Pfizer COVID-19 Vaccine 03/31/2019 10:41 AM 0.3 mL 01/26/2019 Intramuscular   Manufacturer: [employer], [employer]   Lot: [id number]   NDC: [address]-5

## 2019-08-14 ENCOUNTER — Telehealth: Payer: Self-pay | Admitting: Neurology

## 2019-08-14 NOTE — Telephone Encounter (Signed)
Pt gave consent for insurance to be filed for vv  Pt understands that although there may be some limitations with this type of visit, we will take all precautions to reduce any security or privacy concerns.  Pt understands that this will be treated like an in office visit and we will file with pt's insurance, and there may be a patient responsible charge related to this service.  

## 2019-08-15 ENCOUNTER — Encounter: Payer: Self-pay | Admitting: Family Medicine

## 2019-08-15 ENCOUNTER — Telehealth (INDEPENDENT_AMBULATORY_CARE_PROVIDER_SITE_OTHER): Payer: Medicare Other | Admitting: Family Medicine

## 2019-08-15 DIAGNOSIS — Z9989 Dependence on other enabling machines and devices: Secondary | ICD-10-CM

## 2019-08-15 DIAGNOSIS — G4733 Obstructive sleep apnea (adult) (pediatric): Secondary | ICD-10-CM | POA: Diagnosis not present

## 2019-08-15 DIAGNOSIS — G44009 Cluster headache syndrome, unspecified, not intractable: Secondary | ICD-10-CM

## 2019-08-15 NOTE — Progress Notes (Addendum)
PATIENT: Katherine Pugh DOB: 02-May-1950  REASON FOR VISIT: follow up HISTORY FROM: patient  Virtual Visit via Telephone Note  I connected with Nicholos Johns on 08/15/19 at  9:00 AM EDT by telephone and verified that I am speaking with the correct person using two identifiers.   I discussed the limitations, risks, security and privacy concerns of performing an evaluation and management service by telephone and the availability of in person appointments. I also discussed with the patient that there may be a patient responsible charge related to this service. The patient expressed understanding and agreed to proceed.   History of Present Illness:  08/15/19 Katherine Pugh is a 69 y.o. female here today for follow up for OSA on CPAP. She is doing very well. She returns for annual follow up. She is using CPAP nightly. She rarely has headaches. She denies any concerns with therapy with the exception of intermittent tenderness of nares with nasal pillow. She has tried multiple masks and feels this is the best fit.   Compliance report dated 07/16/2019 through 08/14/2019 reveals that she used CPAP 30 of the past 30 days for compliance of 100%.  She used CPAP greater than 4 hours 30 of the past 30 days for compliance of 100%.  Average usage was 7 hours and 9 minutes.  Residual AHI was 1.0 on 4 to 10 cm of water and an EPR of 3.  There was no significant leak noted.  HISTORY: (copied from Dr Dohmeier's note on 08/08/2018)   Katherine Pugh is a 69 y.o. female, was seen in a referralit from Dr. Lucia Gaskins for a sleep evaluation in regards to sleep related headaches.   Katherine Pugh reports that she had once been tested in the sleep lab in [place], [place]. At the time she did not receive a call back, no treatment was initiated and she assumed that no abnormalities were found. She had presented to Dr. Lucia Gaskins for evaluation of headaches that begun about 14 months ago. She describes her headaches as  waking up with them in the middle of the night they wake her out of sleep. They're severe, sudden and has the character of an attack that feels like a stroke. Her neck would be tense, it happens up to twice a week. It does not happen in the first 2 hours of sleep but more towards the morning hours. The headache is global does not involve one side of the head or 1 region only and can last up to 20 minutes. She avoids movement but having the headaches and had no nausea or vomiting, no light or sound sensitivity associated with it. She sometimes has numbness and left arm pain. About 6 years ago she noted numbness and facial weakness on the left angle of the mouth.    Chief complaint according to patient : " I need to know if my headaches are related to sleep "  Sleep habits are as follows: She goes to bed between 10 and 11 PM, often experiencing a sleep latency of 30 minutes to an hour. The bedroom is described as cool, quiet and dark. Due to back problems she avoid supine sleep but she also has shoulder pain which makes lateral sleep uncomfortable. She moves a lot. She does report vivid dreams, usually not nightmarish in character. She usually has one bathroom break. She rises around 7 AM. She wakes up spontaneously. She has not had recently severe sleep interrupting headaches but they used to come  on in the 3 or 4 AM. She believes that her neck was the cause. No naps in daytime.   Sleep medical history and family sleep history:  Fibromyalgia , hypokalemia, snoring, HTN, enlarged heart/    One biological daughter with aggressive MS diagnosed age 75, now 29 -lives in [place], takes [place]. Other children healthy.   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  Katherine Pugh is a married mother of 3 adult daughters, and has 3 adopted children as well ( 2 nephews adopted after their father's death) . She is expecting her 10th grandchild to be born this  August. She drinks espresso and coffee in the mornings nor soda or ice tea, she is a nontobacco user, she drinks wine and beer- 1-3 a week.  Interview history, from 09/20/2016. Katherine Pugh was referred by Dr. Lucia Gaskins for a sleep evaluation in regards to sleep related headaches. She tested positive for mild OSA with long hypoxemia and REM accentuation to an AHI of 55. We discussed today treatment, including improving nasal patency, she has not taken narcotics in weeks, she wants to loose weight further, and she is claustrophobic- should she go on CPAP is her question. She is not excessivly daytime sleepy. We agreed to give CPAP a chance, she will prep her nose.   Interval history from 10 February 2017, I meeting Katherine Pugh today for a compliance report on her CPAP use.  She has been a highly compliant CPAP user 87% average using the machine for 5 hours and 17 minutes, CPAP is at a low pressure overcoming the patient's hypercapnia.  She is using 5 cmH2O with EPR and her residual AHI is 0.7.  She does use a nasal pillow irritation at the nostril, was told by Efraim Kaufmann from [employer] that she will send her a different nasal pillow probably an extra small and a different style- e still waiting for supply.  She is also planning to go to [place] for over 4 weeks and spring like for her to take her CPAP with her have the distilled water purchased a ahead of time I would invite her to investigate if she can actually pack her CPAP or as travel on.  08-04-2017 This patient's Baseline Polysomnogram with capnography on 08/05/2016 resulted in a diagnosis of mild Obstructive Sleep  Apnea (OSA) AHI 12.6/hr. with REM sleep exacerbation to AHI of 55.1/hr. Low oxygen nadir (73%) and prolonged total desaturation time below SpO2 89% of 41 min. She was titrated to CPAP and has been very compliant, has noted alertness to have improved, BP is lower, and her headaches are much better.  Review of Katherine Pugh has become a highly  compliant CPAP user 100% compliance for the last 30 days prior to 18 June.  Average use of time 6 hours 56 minutes, AutoSet is used between 4 and 10 cmH2O with 3 cm EPR, AHI is 0.8.  This is an excellent resolution very good compliance and her 95th percentile pressure is at 8.3 she does not have air leaks.  She had tried the DreamWear mask but the nasal pillows irritated her cartilage part of the nasal septum.  She is now using a Radiographer, therapeutic and paykel ESON XS  Mask.  PS : She encountered many difficulties obtaining distilled water for her CPAP while in small town [place].   RV next year- 07-2017   Observations/Objective:  Generalized: Well developed, in no acute distress  Mentation: Alert oriented to time,  place, history taking. Follows all commands speech and language fluent   Assessment and Plan:  69 y.o. year old female  has a past medical history of Abnormal uterine bleeding, Atypical chest pain (01/05/2011), Chronic back pain, Episodic cluster headache, not intractable (07/21/2016), Fibromyalgia, Hiatal hernia, High cholesterol, Hypertension, Leg edema-left (01/05/2011), Myalgia (07/21/2016), OSA on CPAP (08/04/2017), Palpitations-nocturnal (01/05/2011), PVC's (premature ventricular contractions), and Vasomotor rhinitis (07/21/2016). here with    ICD-10-CM   1. OSA on CPAP  G47.33 For home use only DME continuous positive airway pressure (CPAP)   Z99.89   2. Cluster headache, not intractable, unspecified chronicity pattern  G44.009    Roe Coombs is doing well today.  Compliance report reveals excellent compliance with CPAP therapy.  She was encouraged to continue daily usage with 4-hour usage each day.  Fortunately, her headaches have resolved.  Healthy lifestyle habits encouraged.  She will follow-up closely with primary care.  She will follow-up with [place] in 1 year, sooner if needed.  She verbalizes understanding and agreement with this plan.   Orders Placed This Encounter  Procedures  . For home use  only DME continuous positive airway pressure (CPAP)    Supplies    Order Specific Question:   Length of Need    Answer:   Lifetime    Order Specific Question:   Patient has OSA or probable OSA    Answer:   Yes    Order Specific Question:   Is the patient currently using CPAP in the home    Answer:   Yes    Order Specific Question:   Settings    Answer:   Other see comments    Order Specific Question:   CPAP supplies needed    Answer:   Mask, headgear, cushions, filters, heated tubing and water chamber    No orders of the defined types were placed in this encounter.    Follow Up Instructions:  I discussed the assessment and treatment plan with the patient. The patient was provided an opportunity to ask questions and all were answered. The patient agreed with the plan and demonstrated an understanding of the instructions.   The patient was advised to call back or seek an in-person evaluation if the symptoms worsen or if the condition fails to improve as anticipated.  I provided 15 minutes of non-face-to-face time during this encounter.  Done at her place of residence during my chart visit.  Provider is in the office.   Shawnie Dapper, NP    Made any corrections needed, and agree with history, physical, neuro exam,assessment and plan as stated.     Naomie Dean, MD [facility]

## 2019-12-06 ENCOUNTER — Other Ambulatory Visit: Payer: Self-pay | Admitting: Internal Medicine

## 2019-12-06 DIAGNOSIS — M5134 Other intervertebral disc degeneration, thoracic region: Secondary | ICD-10-CM

## 2019-12-06 DIAGNOSIS — M419 Scoliosis, unspecified: Secondary | ICD-10-CM

## 2019-12-26 ENCOUNTER — Other Ambulatory Visit: Payer: Self-pay

## 2019-12-26 ENCOUNTER — Ambulatory Visit
Admission: RE | Admit: 2019-12-26 | Discharge: 2019-12-26 | Disposition: A | Discharge status: Home / Self Care | Payer: Medicare Other | Source: Ambulatory Visit | Attending: Internal Medicine | Admitting: Internal Medicine

## 2019-12-26 DIAGNOSIS — M5134 Other intervertebral disc degeneration, thoracic region: Secondary | ICD-10-CM

## 2019-12-26 DIAGNOSIS — M419 Scoliosis, unspecified: Secondary | ICD-10-CM

## 2020-01-23 DIAGNOSIS — R001 Bradycardia, unspecified: Secondary | ICD-10-CM | POA: Insufficient documentation

## 2020-01-25 ENCOUNTER — Encounter: Payer: Self-pay | Admitting: Internal Medicine

## 2020-01-25 ENCOUNTER — Ambulatory Visit: Payer: Medicare Other | Admitting: Internal Medicine

## 2020-01-25 ENCOUNTER — Other Ambulatory Visit: Payer: Self-pay

## 2020-01-25 VITALS — BP 152/84 | HR 60 | Ht 64.0 in | Wt 190.0 lb

## 2020-01-25 DIAGNOSIS — R002 Palpitations: Secondary | ICD-10-CM | POA: Diagnosis not present

## 2020-01-25 DIAGNOSIS — R001 Bradycardia, unspecified: Secondary | ICD-10-CM

## 2020-01-25 NOTE — Progress Notes (Signed)
Patient Care Team: Chilton Greathouse, MD as PCP - General (Internal Medicine)   HPI  Katherine Pugh is a 69 y.o. female seen in follow-up for hypertension and some dyspnea and atypical chest pains.  Overall she is doing relatively well.  She is exercising on her Peloton and elliptical.  Mild changes in exercise tolerance.  No chest pains orthopnea nocturnal dyspnea.  Occasional but infrequent peripheral edema mostly related to excessive salt intake.  Blood pressures have continued to be elevated.  She has treated sleep apnea.   DATE TEST EF   5/15 MYOVIEW   normal % No perfusion defects  10/20 Echo  55-65% PAsys 18mm Ao root 39 mm  11/20 CTA  No obstruction of central veins Ao Root NORMAL   LUE doppler neg    Records and Results Reviewed   Past Medical History:  Diagnosis Date  . Abnormal uterine bleeding   . Atypical chest pain 01/05/2011  . Chronic back pain   . Episodic cluster headache, not intractable 07/21/2016  . Fibromyalgia   . Hiatal hernia   . High cholesterol   . Hypertension   . Leg edema-left 01/05/2011  . Myalgia 07/21/2016  . OSA on CPAP 08/04/2017  . Palpitations-nocturnal 01/05/2011  . PVC's (premature ventricular contractions)   . Vasomotor rhinitis 07/21/2016    Past Surgical History:  Procedure Laterality Date  . ABDOMINAL HYSTERECTOMY    . BACK SURGERY    . CESAREAN SECTION     x 3  . DILATION AND CURETTAGE OF UTERUS    . HERNIA REPAIR    . KNEE SURGERY Bilateral    x2  . LAPAROSCOPIC CHOLECYSTECTOMY    . THUMB ARTHROSCOPY      Current Meds  Medication Sig  . Ascorbic Acid (VITAMIN C PO) Take 1 tablet by mouth daily.  Marland Kitchen aspirin 81 MG tablet Take 81 mg by mouth daily.  Marland Kitchen BENICAR HCT 40-25 MG per tablet Take 1 tablet by mouth daily.   . Biotin 1000 MCG tablet Take 1,000 mcg by mouth daily.  . Cholecalciferol (VITAMIN D3 PO) Take 1 tablet by mouth daily.  . Cyanocobalamin (VITAMIN B12 PO) Take 1 tablet by mouth daily.  . diazepam  (VALIUM) 5 MG tablet Take 1 tablet by mouth as needed.  . fluticasone (FLONASE) 50 MCG/ACT nasal spray Place 2 sprays into both nostrils daily. As needed  . loratadine (CLARITIN) 10 MG tablet Take 10 mg by mouth daily.  Marland Kitchen PATADAY 0.2 % SOLN daily as needed. As needed  . potassium chloride (KLOR-CON) 10 MEQ tablet Take 20 mEq by mouth 2 (two) times daily.  . rosuvastatin (CRESTOR) 5 MG tablet Take 1 tablet by mouth. Monday through Friday  . Zinc 10 MG LOZG Take 1 tablet by mouth daily.  Marland Kitchen zolpidem (AMBIEN) 10 MG tablet As needed    No Active Allergies    Review of Systems negative except from HPI and PMH  Physical Exam BP (!) 152/84   Pulse 60   Ht 5\' 4"  (1.626 m)   Wt 190 lb (86.2 kg)   BMI 32.61 kg/m  Well developed and well nourished in no acute distress HENT normal E scleral and icterus clear Neck Supple JVP flat; carotids brisk and full Clear to ausculation Regular rate and rhythm, no murmurs gallops or rub Soft with active bowel sounds No clubbing cyanosis  Edema Alert and oriented, grossly normal motor and sensory function Skin Warm and Dry  ECG sinuys @  60 13/09/41 CrCl cannot be calculated (Patient's most recent lab result is older than the maximum 21 days allowed.).   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  Continue the Benicar but will add amlodipine 2.5 mg.  Reviewed side effects.  Reviewed her echocardiogram with its 3 notable findings-numeral 1-left atrial enlargement-mild, 2-mild pulmonary hypertension with a PA pressure of about 39,  #3-aortic root enlargement.  She had undergone CTA.  This reports that there is no aortic root enlargement  Her PA pressures may likely be related to untreated sleep apnea.  We will repeat the echo in about a year.    Current  medicines are reviewed at length with the patient today .  The patient does not  have concerns regarding medicines.

## 2020-01-25 NOTE — Patient Instructions (Signed)
Medication Instructions:  Your physician recommends that you continue on your current medications as directed. Please refer to the Current Medication list given to you today.  *If you need a refill on your cardiac medications before your next appointment, please call your pharmacy*   Lab Work: None ordered.  If you have labs (blood work) drawn today and your tests are completely normal, you will receive your results only by: Marland Kitchen MyChart Message (if you have MyChart) OR . A paper copy in the mail If you have any lab test that is abnormal or we need to change your treatment, we will call you to review the results.   Testing/Procedures: None ordered.    Follow-Up: At [facility], you and your health needs are our priority.  As part of our continuing mission to provide you with exceptional heart care, we have created designated Provider Care Teams.  These Care Teams include your primary Cardiologist (physician) and Advanced Practice Providers (APPs -  Physician Assistants and Nurse Practitioners) who all work together to provide you with the care you need, when you need it.  We recommend signing up for the patient portal called "MyChart".  Sign up information is provided on this After Visit Summary.  MyChart is used to connect with patients for Virtual Visits (Telemedicine).  Patients are able to view lab/test results, encounter notes, upcoming appointments, etc.  Non-urgent messages can be sent to your provider as well.   To learn more about what you can do with MyChart, go to [email or web address].    Your next appointment:   12 month(s)  The format for your next appointment:   In Person  Provider:   Sherryl Manges, MD   Other Instructions Please follow up with PCP as discussed by Dr Graciela Husbands

## 2020-12-14 IMAGING — MR MR THORACIC SPINE W/O CM
4 of 6 series · 15 of 48 positions shown · non-contrast
Comparison: Chest CT 01/04/2019.
COMPARISON: Chest CT 01/04/2019.

Addendum:
CLINICAL DATA: Degenerative disc disease. Scoliosis of thoracic
spine, unspecified scoliosis type. Additional history provided by
scanning technologist: Patient reports increased right-sided pain
for 6 months.

EXAM:
MRI THORACIC SPINE WITHOUT CONTRAST
TECHNIQUE: Multiplanar, multisequence MR imaging of the thoracic spine was
performed. No intravenous contrast was administered.

[Series 4: T2 · sagittal · 4.0mm · 0.47mm/px · 5 of 17 slices shown (1 of 3)]
[im 1/17]
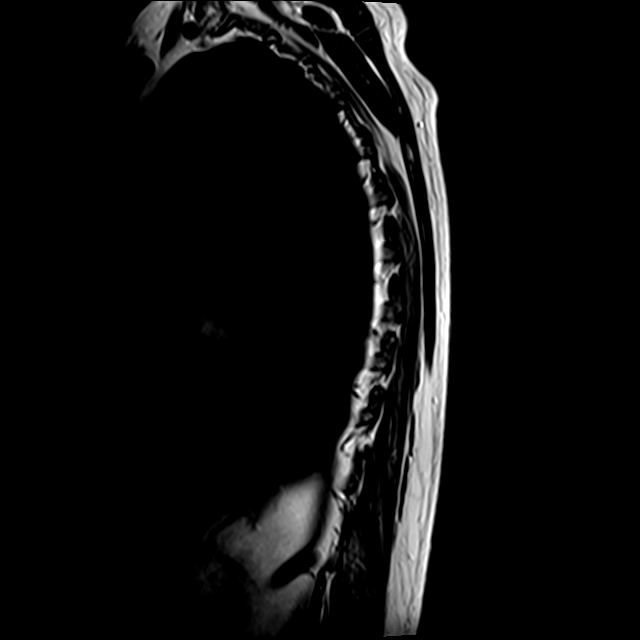
[im 5/17]
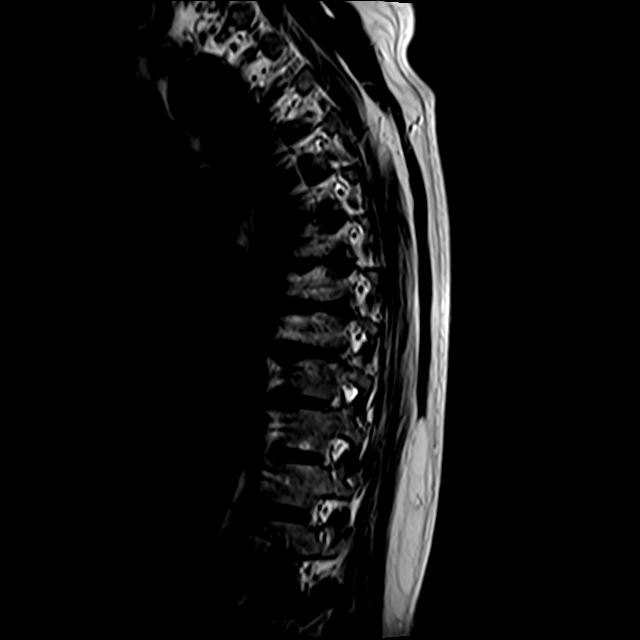
[im 9/17]
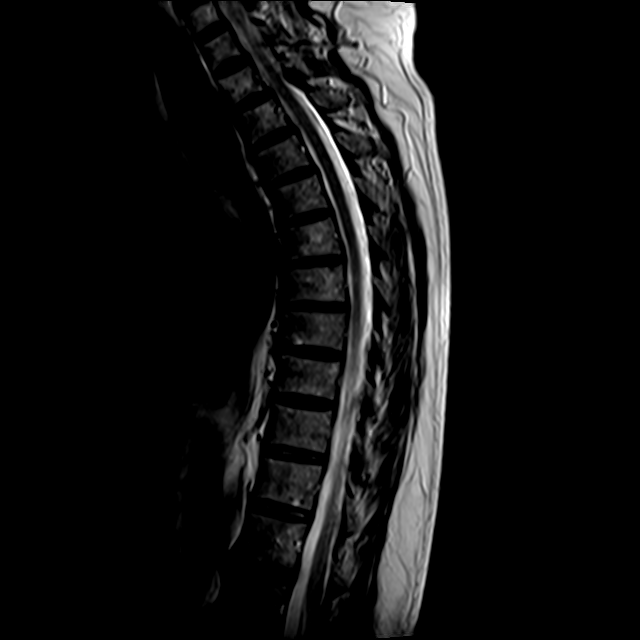
[im 13/17]
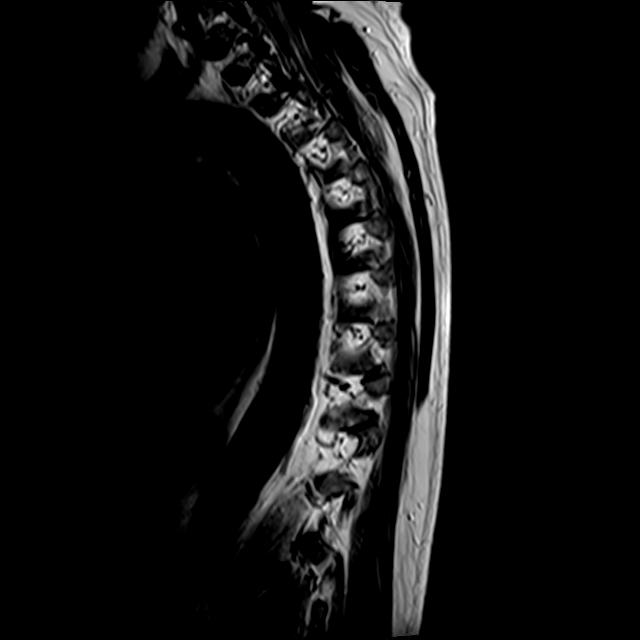
[im 17/17]
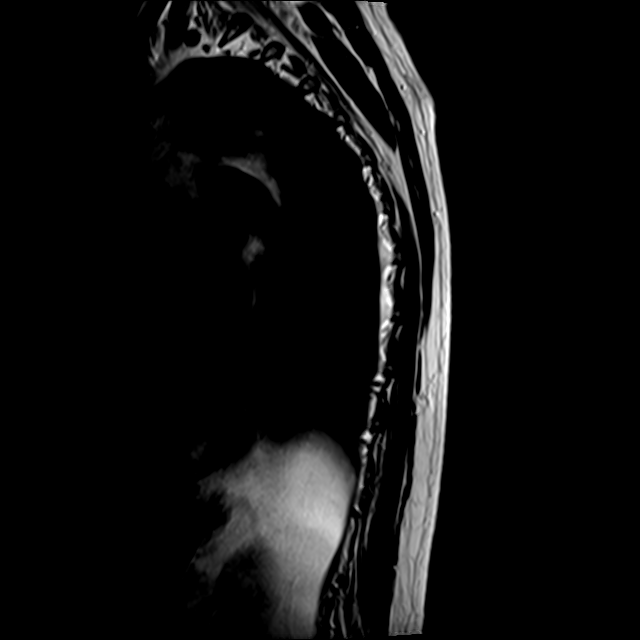

[Series 6: T1 · sagittal · 4.0mm · 0.94mm/px · 3 of 17 slices shown]
[im 4/17]
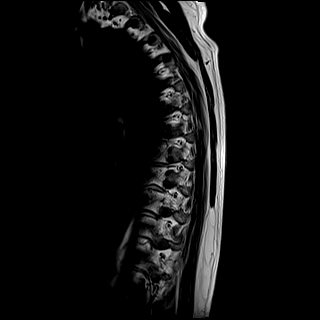
[im 10/17]
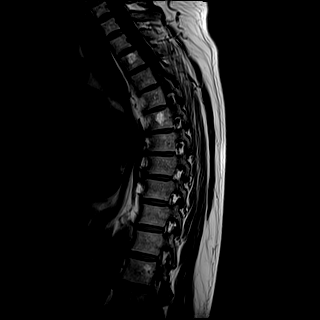
[im 17/17]
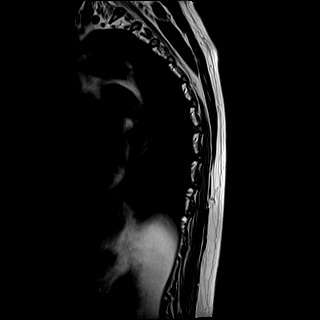

[Series 7: T2 · axial · 4.0mm · 0.39mm/px · z∈[-250,-82]mm · 4 of 39 slices shown (2 of 3)]
[im 1/39]
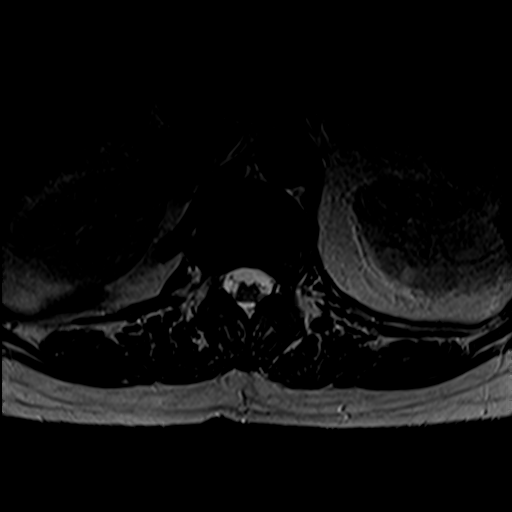
[im 7/39]
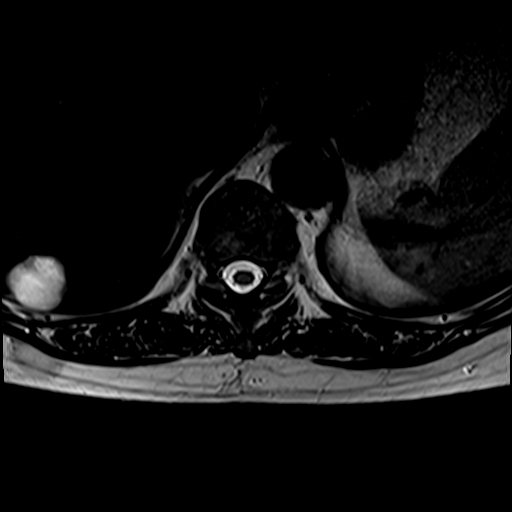
[im 20/39]
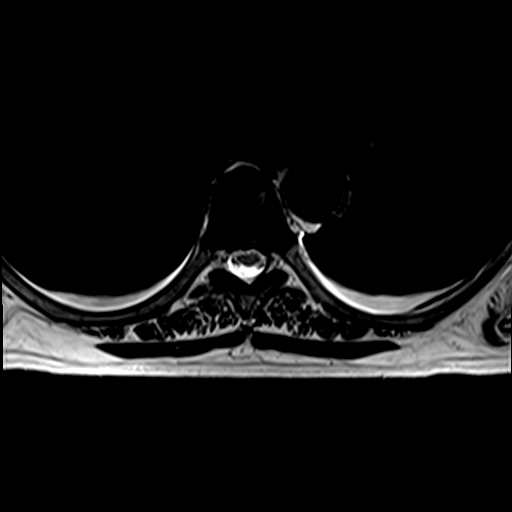
[im 32/39]
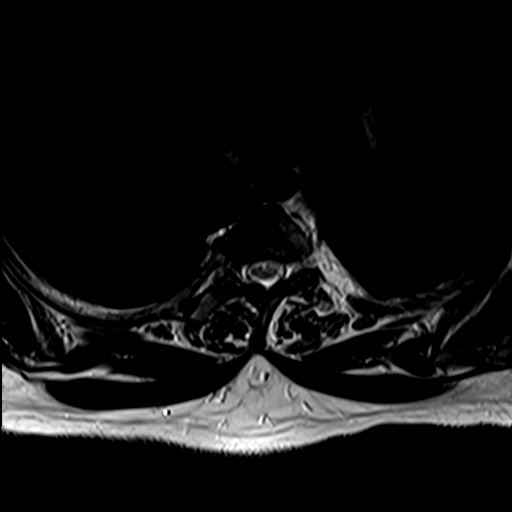

[Series 8: T2 · axial · 4.0mm · 0.39mm/px · z∈[-202,-82]mm · 3 of 39 slices shown (3 of 3)]
[im 7/39]
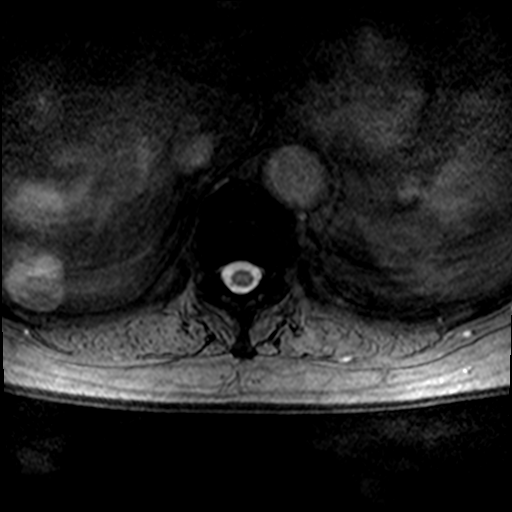
[im 20/39]
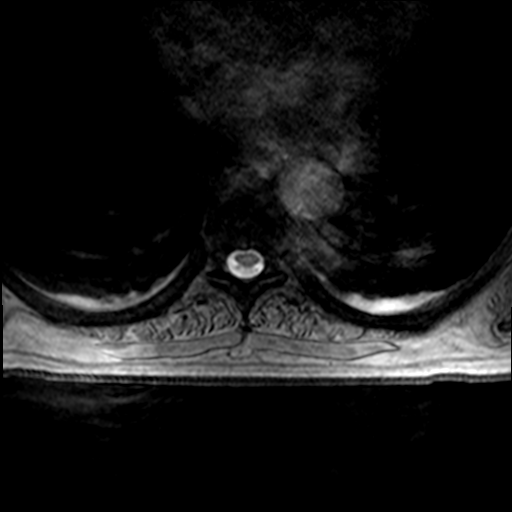
[im 32/39]
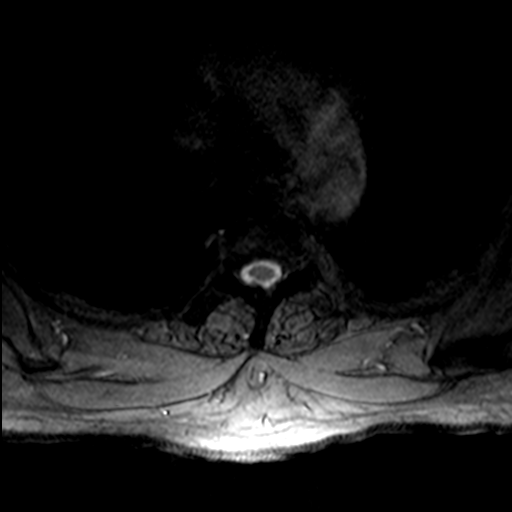

[15 of 48 positions shown; findings below may reference images not displayed]

Thoracic spine MRI 02/13/2012. CT
chest 01/04/2019. CT abdomen/pelvis 07/13/2011.
FINDINGS: Alignment: Mild thoracic dextrocurvature. No significant
spondylolisthesis.

Vertebrae: No fracture, evidence of discitis, or suspicious bone
lesion. Multilevel vertebral body hemangiomas. Trace degenerative
endplate edema at T3-T4 and T8-T9. Multilevel ventrolateral
osteophytes.

Cord:  No spinal cord signal abnormality is identified.

Paraspinal and other soft tissues: Multiple T2 hyperintense lesions
within the liver, previously characterized as cysts. Paraspinal soft
tissues within normal limits

Disc levels:

Unless otherwise stated, the level by level findings below have not
significantly changed since prior MRI 02/13/2012.

Mild multilevel disc degeneration, greatest within the mid thoracic
spine and slightly progressed as compared to the prior MRI.

There are shallow multilevel disc bulges. Mild multilevel facet
arthrosis and ligamentum flavum hypertrophy, greatest at T2-T3.
Additional notable level by level findings as described below.

At T2-T3, there is progressive facet and ligamentum flavum
hypertrophy without significant canal stenosis. Mild bilateral
neural foraminal narrowing, is new from the prior exam.

At T3-T4, there is a small left center disc protrusion. Mild
effacement of the ventral thecal sac without spinal cord mass
effect.

At T6-T7, there is a small central disc protrusion which minimally
effaces the ventral thecal sac. No spinal cord mass effect.

At T7-T8, there is a small right center disc protrusion which
minimally effaces the ventral thecal sac. No spinal cord mass
effect.

At T12-L1, there is a shallow left center/subarticular disc
protrusion which mildly effaces the ventral thecal sac. No spinal
cord mass effect.
IMPRESSION: Thoracic spondylosis as outlined and having slightly progressed
since the prior MRI of 02/13/2012.

There are small disc bulges/disc herniations at multiple levels as
well as mild multilevel facet arthrosis and ligamentum flavum
hypertrophy. No more than mild relative spinal canal narrowing at
any level. No spinal cord mass effect.

At T2-T3, there is progressive facet and ligamentum flavum
hypertrophy which contributes to mild bilateral neural foraminal
narrowing (new from the prior MRI).

No significant foraminal stenosis at the remaining levels.

ADDENDUM:
Case discussed with Dr. Dhillon. Potentially related to the patient's
right-sided radiculopathy is 2 perineural root sleeve cysts on the
right, located at T7-8 and at T9-10. The larger is at T9-10 and
measures 9 x 7 mm, likely 1 mm larger than in 4601. This cyst also
fills the foramen with up lifted appearance of the nerve root.

*** End of Addendum ***
Thoracic spine MRI 02/13/2012. CT
chest 01/04/2019. CT abdomen/pelvis 07/13/2011.
FINDINGS: Alignment: Mild thoracic dextrocurvature. No significant
spondylolisthesis.

Vertebrae: No fracture, evidence of discitis, or suspicious bone
lesion. Multilevel vertebral body hemangiomas. Trace degenerative
endplate edema at T3-T4 and T8-T9. Multilevel ventrolateral
osteophytes.

Cord:  No spinal cord signal abnormality is identified.

Paraspinal and other soft tissues: Multiple T2 hyperintense lesions
within the liver, previously characterized as cysts. Paraspinal soft
tissues within normal limits

Disc levels:

Unless otherwise stated, the level by level findings below have not
significantly changed since prior MRI 02/13/2012.

Mild multilevel disc degeneration, greatest within the mid thoracic
spine and slightly progressed as compared to the prior MRI.

There are shallow multilevel disc bulges. Mild multilevel facet
arthrosis and ligamentum flavum hypertrophy, greatest at T2-T3.
Additional notable level by level findings as described below.

At T2-T3, there is progressive facet and ligamentum flavum
hypertrophy without significant canal stenosis. Mild bilateral
neural foraminal narrowing, is new from the prior exam.

At T3-T4, there is a small left center disc protrusion. Mild
effacement of the ventral thecal sac without spinal cord mass
effect.

At T6-T7, there is a small central disc protrusion which minimally
effaces the ventral thecal sac. No spinal cord mass effect.

At T7-T8, there is a small right center disc protrusion which
minimally effaces the ventral thecal sac. No spinal cord mass
effect.

At T12-L1, there is a shallow left center/subarticular disc
protrusion which mildly effaces the ventral thecal sac. No spinal
cord mass effect.
IMPRESSION: Thoracic spondylosis as outlined and having slightly progressed
since the prior MRI of 02/13/2012.

There are small disc bulges/disc herniations at multiple levels as
well as mild multilevel facet arthrosis and ligamentum flavum
hypertrophy. No more than mild relative spinal canal narrowing at
any level. No spinal cord mass effect.

At T2-T3, there is progressive facet and ligamentum flavum
hypertrophy which contributes to mild bilateral neural foraminal
narrowing (new from the prior MRI).

No significant foraminal stenosis at the remaining levels.

## 2021-06-23 ENCOUNTER — Ambulatory Visit: Payer: Medicare Other | Admitting: Family Medicine

## 2021-06-23 ENCOUNTER — Encounter: Payer: Self-pay | Admitting: Family Medicine

## 2021-06-23 VITALS — BP 160/90 | HR 59 | Ht 64.0 in | Wt 185.4 lb

## 2021-06-23 DIAGNOSIS — Z9989 Dependence on other enabling machines and devices: Secondary | ICD-10-CM

## 2021-06-23 DIAGNOSIS — G4733 Obstructive sleep apnea (adult) (pediatric): Secondary | ICD-10-CM | POA: Diagnosis not present

## 2021-06-23 NOTE — Patient Instructions (Signed)
Please continue using your CPAP regularly. While your insurance requires that you use CPAP at least 4 hours each night on 70% of the nights, I recommend, that you not skip any nights and use it throughout the night if you can. Getting used to CPAP and staying with the treatment long term does take time and patience and discipline. Untreated obstructive sleep apnea when it is moderate to severe can have an adverse impact on cardiovascular health and raise her risk for heart disease, arrhythmias, hypertension, congestive heart failure, stroke and diabetes. Untreated obstructive sleep apnea causes sleep disruption, nonrestorative sleep, and sleep deprivation. This can have an impact on your day to day functioning and cause daytime sleepiness and impairment of cognitive function, memory loss, mood disturbance, and problems focussing. Using CPAP regularly can improve these symptoms. ? ?Keep a close eye on your BP at home.  ? ?Follow up in 1 year  ?

## 2021-06-23 NOTE — Progress Notes (Signed)
? ? ?PATIENT: Katherine Pugh ?DOB: Jun 02, 1950 ? ?REASON FOR VISIT: follow up ?HISTORY FROM: patient ? ?Chief Complaint  ?Patient presents with  ? Follow-up  ?  RM 1, alone. Last seen 08/15/19.  No issues w/ CPAP.   ?  ? ?HISTORY OF PRESENT ILLNESS: ? ?06/23/21 ALL:  ?Katherine Pugh is a 71 y.o. female here today for follow up for OSA on CPAP.  She is using CPAP nightly for about 8 hours. She denies concerns with machine or supplies. She does report having a rough area of skin under her nose. She is very happy with her nasal pillow mask and does not wish to change. BP is usually well managed at home. She continues Benicar as prescribed. Reading was reportedly 130's/80's at recent visit with PCP.  ? ? ? ?08/15/19 ALL (Mychart):  ?Katherine Pugh is a 71 y.o. female here today for follow up for OSA on CPAP. She is doing very well. She returns for annual follow up. She is using CPAP nightly. She rarely has headaches. She denies any concerns with therapy with the exception of intermittent tenderness of nares with nasal pillow. She has tried multiple masks and feels this is the best fit.  ?  ?Compliance report dated 07/16/2019 through 08/14/2019 reveals that she used CPAP 30 of the past 30 days for compliance of 100%.  She used CPAP greater than 4 hours 30 of the past 30 days for compliance of 100%.  Average usage was 7 hours and 9 minutes.  Residual AHI was 1.0 on 4 to 10 cm of water and an EPR of 3.  There was no significant leak noted. ?  ?HISTORY: (copied from Dr Dohmeier's note on 08/08/2018) ?  ? Katherine Pugh is a 71 y.o. female, was seen in a referralit from Dr. Lucia GaskinsAhern for a sleep evaluation in regards to sleep related headaches.  ?  ?Katherine Pugh reports that she had once been tested in [place] ?At the time she did not receive a call back, no treatment was initiated and she assumed that no abnormalities were found. She had presented to Dr. Lucia GaskinsAhern for evaluation of headaches that  begun about 14 months ago. She describes her headaches as waking up with them in the middle of the night they wake her out of sleep. They're severe, sudden and has the character of an attack that feels like a stroke. Her neck would be tense, it happens up to twice a week. It does not happen in the first 2 hours of sleep but more towards the morning hours. The headache is global does not involve one side of the head or 1 region only and can last up to 20 minutes. She avoids movement but having the headaches and had no nausea or vomiting, no light or sound sensitivity associated with it. She sometimes has numbness and left arm pain. About 6 years ago she noted numbness and facial weakness on the left angle of the mouth. ?  ?  ?Chief complaint according to patient : " I need to know if my headaches are related to sleep " ?  ?Sleep habits are as follows: She goes to bed between 10 and 11 PM, often experiencing a sleep latency of 30 minutes to an hour. The bedroom is described as cool, quiet and dark. Due to back problems she avoid supine sleep but she also has shoulder pain which makes lateral sleep uncomfortable. She moves a lot. She  does report vivid dreams, usually not nightmarish in character. She usually has one bathroom break. She rises around 7 AM. She wakes up spontaneously. She has not had recently severe sleep interrupting headaches but they used to come on in the 3 or 4 AM. She believes that her neck was the cause. No naps in daytime.  ?  ?Sleep medical history and family sleep history:  Fibromyalgia , hypokalemia, snoring, HTN, enlarged heart/    ?One biological daughter with aggressive MS diagnosed age 66, now 71 -lives in [place], takes Gilenya. Other children healthy.  ?  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  Katherine Pugh is a married mother of 3 adult daughters, and has 3 adopted children as well ( 2 nephews adopted after their father's death) . She  is expecting her 10th grandchild to be born this August. ?She drinks espresso and coffee in the mornings nor soda or ice tea, she is a nontobacco user, she drinks wine and beer- 1-3 a week. ?  ?Interview history, from 09/20/2016. Katherine Pugh was referred by Dr. Lucia Gaskins for a sleep evaluation in regards to sleep related headaches. She tested positive for mild OSA with long hypoxemia and REM accentuation to an AHI of 55. We discussed today treatment, including improving nasal patency, she has not taken narcotics in weeks, she wants to loose weight further, and she is claustrophobic- should she go on CPAP is her question. She is not excessivly daytime sleepy. We agreed to give CPAP a chance, she will prep her nose.  ?  ?Interval history from 10 February 2017, I meeting Katherine Pugh today for a compliance report on her CPAP use.  She has been a highly compliant CPAP user 87% average using the machine for 5 hours and 17 minutes, CPAP is at a low pressure overcoming the patient's hypercapnia.  She is using 5 cmH2O with EPR and her residual AHI is 0.7.  She does use a nasal pillow irritation at the nostril, was told by Efraim Kaufmann from [employer] that she will send her a different nasal pillow probably an extra small and a different style- e still waiting for supply.  She is also planning to go to [place] for over 4 weeks and spring like for her to take her CPAP with her have the distilled water purchased a ahead of time I would invite her to investigate if she can actually pack her CPAP or as travel on. ?  ?08-04-2017 This patient's Baseline Polysomnogram with capnography on 08/05/2016 resulted in a diagnosis of  mild Obstructive Sleep  ?Apnea (OSA) AHI 12.6/hr. with REM sleep exacerbation to AHI of 55.1/hr. Low oxygen nadir (73%)  and prolonged total desaturation time below SpO2 89% of 41 min. ?She was titrated to CPAP and has been very compliant, has noted alertness to have improved, BP is lower, and her headaches are much  better.  ?Review of Mrs. Cowher has become a highly compliant CPAP user 100% compliance for the last 30 days prior to 18 June.  Average use of time 6 hours 56 minutes, AutoSet is used between 4 and 10 cmH2O with 3 cm EPR, AHI is 0.8.  This is an excellent resolution very good compliance and her 95th percentile pressure is at 8.3 she does not have air leaks.  She had tried the DreamWear mask but the nasal pillows irritated her cartilage part of the nasal septum.  She is now using a Radiographer, therapeutic and paykel  ESON XS  Mask. ?  ?PS : She encountered many difficulties obtaining distilled water for her CPAP while in [place] town [place].  ?  ?RV next year- 07-2017 ?  ? ?REVIEW OF SYSTEMS: Out of a complete 14 system review of symptoms, the patient complains only of the following symptoms, none and all other reviewed systems are negative. ? ?ESS: 6 ? ?ALLERGIES: ?No Active Allergies ? ?HOME MEDICATIONS: ?Outpatient Medications Prior to Visit  ?Medication Sig Dispense Refill  ? Ascorbic Acid (VITAMIN Pugh PO) Take 1 tablet by mouth daily.    ? aspirin 81 MG tablet Take 81 mg by mouth daily.    ? BENICAR HCT 40-25 MG per tablet Take 1 tablet by mouth daily.     ? Biotin 1000 MCG tablet Take 1,000 mcg by mouth daily.    ? Cholecalciferol (VITAMIN D3 PO) Take 1 tablet by mouth daily.    ? Cyanocobalamin (VITAMIN B12 PO) Take 1 tablet by mouth daily.    ? diazepam (VALIUM) 5 MG tablet Take 1 tablet by mouth as needed.    ? fluticasone (FLONASE) 50 MCG/ACT nasal spray Place 2 sprays into both nostrils daily. As needed    ? loratadine (CLARITIN) 10 MG tablet Take 10 mg by mouth daily.    ? PATADAY 0.2 % SOLN daily as needed. As needed    ? potassium chloride (KLOR-CON) 10 MEQ tablet Take 20 mEq by mouth 2 (two) times daily.    ? rosuvastatin (CRESTOR) 5 MG tablet Take 1 tablet by mouth. Monday through Friday    ? Zinc 10 MG LOZG Take 1 tablet by mouth daily.    ? zolpidem (AMBIEN) 10 MG tablet As needed    ? ?No facility-administered  medications prior to visit.  ? ? ?PAST MEDICAL HISTORY: ?Past Medical History:  ?Diagnosis Date  ? Abnormal uterine bleeding   ? Atypical chest pain 01/05/2011  ? Chronic back pain   ? Episodic cluster headache,

## 2021-06-24 NOTE — Progress Notes (Signed)
CM sent to [facility] for new order ?

## 2021-07-15 ENCOUNTER — Encounter: Payer: Self-pay | Admitting: Internal Medicine

## 2021-07-15 ENCOUNTER — Ambulatory Visit: Payer: Medicare Other | Admitting: Internal Medicine

## 2021-07-15 VITALS — BP 144/88 | HR 62 | Ht 64.5 in | Wt 186.2 lb

## 2021-07-15 DIAGNOSIS — R001 Bradycardia, unspecified: Secondary | ICD-10-CM | POA: Diagnosis not present

## 2021-07-15 DIAGNOSIS — I7789 Other specified disorders of arteries and arterioles: Secondary | ICD-10-CM

## 2021-07-15 DIAGNOSIS — R002 Palpitations: Secondary | ICD-10-CM

## 2021-07-15 NOTE — Patient Instructions (Addendum)
Medication Instructions:  Your physician recommends that you continue on your current medications as directed. Please refer to the Current Medication list given to you today.  *If you need a refill on your cardiac medications before your next appointment, please call your pharmacy*   Lab Work: None ordered.  If you have labs (blood work) drawn today and your tests are completely normal, you will receive your results only by: MyChart Message (if you have MyChart) OR A paper copy in the mail If you have any lab test that is abnormal or we need to change your treatment, we will call you to review the results.   Testing/Procedures: Your physician has requested that you have an echocardiogram. Echocardiography is a painless test that uses sound waves to create images of your heart. It provides your doctor with information about the size and shape of your heart and how well your heart's chambers and valves are working. This procedure takes approximately one hour. There are no restrictions for this procedure.    Follow-Up: At [facility], you and your health needs are our priority.  As part of our continuing mission to provide you with exceptional heart care, we have created designated Provider Care Teams.  These Care Teams include your primary Cardiologist (physician) and Advanced Practice Providers (APPs -  Physician Assistants and Nurse Practitioners) who all work together to provide you with the care you need, when you need it.  We recommend signing up for the patient portal called "MyChart".  Sign up information is provided on this After Visit Summary.  MyChart is used to connect with patients for Virtual Visits (Telemedicine).  Patients are able to view lab/test results, encounter notes, upcoming appointments, etc.  Non-urgent messages can be sent to your provider as well.   To learn more about what you can do with MyChart, go to [email or web address].    Your next appointment:   12  months with Dr Klein  Important Information About Sugar       

## 2021-07-15 NOTE — Progress Notes (Unsigned)
Patient Care Team: Chilton Greathouse, MD as PCP - General (Internal Medicine)   HPI  Katherine Pugh is a 71 y.o. female seen in follow-up for hypertension and some dyspnea and atypical chest pains.  The patient denies chest pain, shortness of breath, nocturnal dyspnea, orthopnea or peripheral edema.  There have been no palpitations, lightheadedness or syncope.   She has treated sleep apnea.   DATE TEST EF   5/15 MYOVIEW   normal % No perfusion defects  10/20 Echo  55-65% PAsys 25mm Ao root 39 mm  11/20 CTA  No obstruction of central veins Ao Root NORMAL             Records and Results Reviewed   Past Medical History:  Diagnosis Date   Abnormal uterine bleeding    Atypical chest pain 01/05/2011   Chronic back pain    Episodic cluster headache, not intractable 07/21/2016   Fibromyalgia    Hiatal hernia    High cholesterol    Hypertension    Leg edema-left 01/05/2011   Myalgia 07/21/2016   OSA on CPAP 08/04/2017   Palpitations-nocturnal 01/05/2011   PVC's (premature ventricular contractions)    Vasomotor rhinitis 07/21/2016    Past Surgical History:  Procedure Laterality Date   ABDOMINAL HYSTERECTOMY     BACK SURGERY     CESAREAN SECTION     x 3   DILATION AND CURETTAGE OF UTERUS     HERNIA REPAIR     KNEE SURGERY Bilateral    x2   LAPAROSCOPIC CHOLECYSTECTOMY     THUMB ARTHROSCOPY      Current Meds  Medication Sig   Ascorbic Acid (VITAMIN C PO) Take 1 tablet by mouth daily.   aspirin 81 MG tablet Take 81 mg by mouth daily.   BENICAR HCT 40-25 MG per tablet Take 1 tablet by mouth daily.    Biotin 1000 MCG tablet Take 1,000 mcg by mouth daily.   Cholecalciferol (VITAMIN D3 PO) Take 1 tablet by mouth daily.   Cyanocobalamin (VITAMIN B12 PO) Take 1 tablet by mouth daily.   diazepam (VALIUM) 5 MG tablet Take 1 tablet by mouth as needed.   fluticasone (FLONASE) 50 MCG/ACT nasal spray Place 2 sprays into both nostrils daily. As needed   loratadine  (CLARITIN) 10 MG tablet Take 10 mg by mouth daily.   potassium chloride (KLOR-CON) 10 MEQ tablet Take 10 mEq by mouth daily in the afternoon.   rosuvastatin (CRESTOR) 5 MG tablet Take 1 tablet by mouth. Monday through Friday   Zinc 10 MG LOZG Take 1 tablet by mouth daily.   zolpidem (AMBIEN) 10 MG tablet As needed    No Active Allergies    Review of Systems negative except from HPI and PMH  Physical Exam BP (!) 144/88 (BP Location: Left Arm, Patient Position: Sitting, Cuff Size: Large)   Pulse 62   Ht 5' 4.5" (1.638 m)   Wt 186 lb 3.2 oz (84.5 kg)   SpO2 97%   BMI 31.47 kg/m  Well developed and nourished in no acute distress HENT normal Neck supple with JVP-  flat  Clear Regular rate and rhythm, no murmurs or gallops Abd-soft with active BS No Clubbing cyanosis edema Skin-warm and dry A & Oriented  Grossly normal sensory and motor function  ECG  sinus  @ 62 14/08/42  CrCl cannot be calculated (Patient's most recent lab result is older than the maximum 21 days allowed.).   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  Reviewing clinical key, she would need to be off her ARB for about 6 weeks prior to testing.  I reached out to her primary care physician regarding this and the potential use of hydralazine as a bridging therapy.  I would be inclined to use spironolactone if he feels that further testing is not indicated.  Her aortic root dilatation was erroneously reported, not evident on CT scanning.  She did have mild pulmonary hypertension and we will recheck that today.  No palpitations.  Current medicines are reviewed at length with the patient today .  The patient does not  have concerns regarding medicines.

## 2021-08-05 ENCOUNTER — Ambulatory Visit (HOSPITAL_COMMUNITY): Payer: Medicare Other | Attending: Cardiology

## 2021-08-05 DIAGNOSIS — R600 Localized edema: Secondary | ICD-10-CM | POA: Insufficient documentation

## 2021-08-05 DIAGNOSIS — G4730 Sleep apnea, unspecified: Secondary | ICD-10-CM | POA: Diagnosis not present

## 2021-08-05 DIAGNOSIS — I7789 Other specified disorders of arteries and arterioles: Secondary | ICD-10-CM | POA: Diagnosis not present

## 2021-08-05 DIAGNOSIS — I77810 Thoracic aortic ectasia: Secondary | ICD-10-CM | POA: Diagnosis not present

## 2021-08-05 DIAGNOSIS — R001 Bradycardia, unspecified: Secondary | ICD-10-CM

## 2021-08-05 DIAGNOSIS — I493 Ventricular premature depolarization: Secondary | ICD-10-CM | POA: Insufficient documentation

## 2021-08-05 DIAGNOSIS — M797 Fibromyalgia: Secondary | ICD-10-CM | POA: Diagnosis not present

## 2021-08-05 DIAGNOSIS — R079 Chest pain, unspecified: Secondary | ICD-10-CM | POA: Diagnosis not present

## 2021-08-05 DIAGNOSIS — E785 Hyperlipidemia, unspecified: Secondary | ICD-10-CM | POA: Diagnosis not present

## 2021-08-05 DIAGNOSIS — I10 Essential (primary) hypertension: Secondary | ICD-10-CM | POA: Insufficient documentation

## 2021-08-05 DIAGNOSIS — R002 Palpitations: Secondary | ICD-10-CM | POA: Diagnosis not present

## 2021-08-05 LAB — ECHOCARDIOGRAM COMPLETE
Area-P 1/2: 3.21 cm2
S' Lateral: 2.9 cm

## 2021-08-20 ENCOUNTER — Encounter: Payer: Self-pay | Admitting: Internal Medicine

## 2021-09-29 ENCOUNTER — Telehealth: Payer: Self-pay | Admitting: Internal Medicine

## 2021-09-29 NOTE — Telephone Encounter (Signed)
Spoke with pt who states she saw a PA at her PCP office this afternoon re: elevated BP's, swelling and HA.  Pt states she was given Clonidine in the office and advised to take another Clonidine tomorrow morning along with Valium and to contact PCP office tomorrow am as well.  Pt feels she cannot tolerate the Hydralazine.  She is feeling much better now.  Reviewed ED precautions. Per Dr Odessa Fleming note he has reached out to Dr Felipa Eth.

## 2021-09-29 NOTE — Telephone Encounter (Signed)
Pt c/o BP issue: STAT if pt c/o blurred vision, one-sided weakness or slurred speech  1. What are your last 5 BP readings?  178/88 163/84 166/93 176/93 159/74 188/100  2. Are you having any other symptoms (ex. Dizziness, headache, blurred vision, passed out)?she is having a headache, she can't walk around it hurts so bad. Her fingers are swollen.   3. What is your BP issue? Patient's spouse called stating she was taken off of her regular BP medication to run some test and was put on hydralazine 50mg  and hydrochlorothiazide 25mg .  She is having elevated BP.  Spouse states he called into the medical doctor but hasn't received a call back.

## 2021-09-29 NOTE — Telephone Encounter (Signed)
  I reached out to Dr. Felipa Eth for his input.  Unfortunately I have not heard back yet from him I will send him another message now  [name], MD

## 2021-09-29 NOTE — Telephone Encounter (Signed)
Spoke with pt who states she saw a PA at her PCP office this afternoon re: elevated BP's, swelling and HA.  Pt states she was given Clonidine in the office and advised to take another Clonidine tomorrow morning along with Valium and to contact PCP office tomorrow am as well.  Pt feels she cannot tolerate the Hydralazine.  She is feeling much better now.  Reviewed ED precautions. Will have Dr Graciela Husbands review and make further recommendation.

## 2021-09-30 ENCOUNTER — Other Ambulatory Visit: Payer: Self-pay | Admitting: Internal Medicine

## 2021-09-30 DIAGNOSIS — I272 Pulmonary hypertension, unspecified: Secondary | ICD-10-CM

## 2021-09-30 DIAGNOSIS — R519 Headache, unspecified: Secondary | ICD-10-CM

## 2021-10-07 ENCOUNTER — Ambulatory Visit
Admission: RE | Admit: 2021-10-07 | Discharge: 2021-10-07 | Disposition: A | Discharge status: Home / Self Care | Payer: Medicare Other | Source: Ambulatory Visit | Attending: Internal Medicine | Admitting: Internal Medicine

## 2021-10-07 DIAGNOSIS — I272 Pulmonary hypertension, unspecified: Secondary | ICD-10-CM

## 2021-10-07 DIAGNOSIS — R519 Headache, unspecified: Secondary | ICD-10-CM

## 2021-10-07 MED ORDER — IOPAMIDOL (ISOVUE-300) INJECTION 61%
100.0000 mL | Freq: Once | INTRAVENOUS | Status: AC | PRN
Start: 2021-10-07 — End: 2021-10-07
  Administered 2021-10-07: 100 mL via INTRAVENOUS

## 2021-10-12 ENCOUNTER — Encounter: Payer: Self-pay | Admitting: Internal Medicine

## 2021-11-27 NOTE — Telephone Encounter (Signed)
I do see the CT  Did you reach out to [place]? Thanks SK

## 2021-11-27 NOTE — Telephone Encounter (Signed)
Please Inform Patient That 1) the results should indeed come from [facility], but 2) I dont see them in the computer anyway  Thanks

## 2022-03-02 ENCOUNTER — Telehealth: Payer: Self-pay | Admitting: Surgery

## 2022-03-02 NOTE — Telephone Encounter (Signed)
     Telephone call to patient to discuss options for continued management of a left adrenal nodule.  I have discussed her case with her primary care physician, Dr. Dagmar Hait, and with 2 of the interventional radiologists, Dr. Ronny Bacon and Dr. Aletta Edouard.  I have reviewed her laboratory studies.  We discussed the possibility of performing an MRI scan.  However, the radiologist felt that the lesion was so small that MRI scanning may not be worthwhile.  Laboratory studies show that her electrolytes are essentially normal, however the patient does take a moderate amount of supplemental potassium daily.  We discussed proceeding with selective venous sampling of the adrenal veins by interventional radiology.  This is certainly a more invasive study and is expensive.  At this time my recommendation would be to wait until August 2024 and repeat the CT scan of the abdomen and pelvis with contrast as a 1 year follow-up study.  We will ask the radiologist to review the study and determine whether or not there has been any significant change in the adrenal nodule.  We can also consider whether or not to perform an MRI at that time.  If there are significant changes, we may consider proceeding with selective venous sampling.  We will arrange for follow-up CT scan in August 2024.  I will see the patient in the office following that study to review the results and to discuss options for further management.  Armandina Gemma, [facility]: [phone]

## 2022-06-24 NOTE — Progress Notes (Signed)
PATIENT: Katherine Pugh DOB: 1950-09-23  REASON FOR VISIT: follow up HISTORY FROM: patient  Chief Complaint  Patient presents with   Follow-up    Pt in room 1. Here for cpap follow up. Pt has a travel cpap as well having a hard time adjusting to travel cpap. Pt said travel cpap makes a loud noise, pt said she feels like pressure setting maybe off maybe. Pt said it could be mask, mask causes sores in nose. Home cpap is fine. Pt just finished prednisone for her back pain.  Pt blood pressure is elevated.     HISTORY OF PRESENT ILLNESS:  06/28/22 ALL:  Katherine Pugh returns for follow up for OSA on CPAP. She reports doing well with home CPAP. She is not tolerating travel machine as well. Nasal pillow mask causes nasal tenderness. She reports travel machine makes loud noise. DME did let her know this was a common finding with her travel machine. We have no documentation of travel machine orders with pressure settings. She is unsure what her pressure settings are on travel machine.   BP is usually well managed at home (less than 140 sys). She reports Benicar was stopped by cardiology for adrenal gland testing. Could not complete testing due to hypertension. Benicar was restarted. She was started on clonidine if BP > 150. She recently completed a prednisone taper back for back pain. She has not been able to work out recently.     06/23/2021 ALL: Katherine Pugh is a 72 y.o. female here today for follow up for OSA on CPAP.  She is using CPAP nightly for about 8 hours. She denies concerns with machine or supplies. She does report having a rough area of skin under her nose. She is very happy with her nasal pillow mask and does not wish to change. BP is usually well managed at home. She continues Benicar as prescribed. Reading was reportedly 130's/80's at recent visit with PCP.     08/15/19 ALL (Mychart):  Katherine Pugh is a 72 y.o. female here today for follow up for OSA on CPAP. She is doing very well.  She returns for annual follow up. She is using CPAP nightly. She rarely has headaches. She denies any concerns with therapy with the exception of intermittent tenderness of nares with nasal pillow. She has tried multiple masks and feels this is the best fit.    Compliance report dated 07/16/2019 through 08/14/2019 reveals that she used CPAP 30 of the past 30 days for compliance of 100%.  She used CPAP greater than 4 hours 30 of the past 30 days for compliance of 100%.  Average usage was 7 hours and 9 minutes.  Residual AHI was 1.0 on 4 to 10 cm of water and an EPR of 3.  There was no significant leak noted.   HISTORY: (copied from Dr Dohmeier's note on 08/08/2018)    Katherine Pugh is a 72 y.o. female, was seen in a referralit from Dr. Lucia Gaskins for a sleep evaluation in regards to sleep related headaches.    Katherine Pugh reports that she had once been tested in the sleep lab in [place], [place]. At the time she did not receive a call back, no treatment was initiated and she assumed that no abnormalities were found. She had presented to Dr. Lucia Gaskins for evaluation of headaches that begun about 14 months ago. She describes her headaches as waking up with them in the middle of the night they wake  her out of sleep. They're severe, sudden and has the character of an attack that feels like a stroke. Her neck would be tense, it happens up to twice a week. It does not happen in the first 2 hours of sleep but more towards the morning hours. The headache is global does not involve one side of the head or 1 region only and can last up to 20 minutes. She avoids movement but having the headaches and had no nausea or vomiting, no light or sound sensitivity associated with it. She sometimes has numbness and left arm pain. About 6 years ago she noted numbness and facial weakness on the left angle of the mouth.     Chief complaint according to patient : " I need to know if my headaches are related to sleep "   Sleep  habits are as follows: She goes to bed between 10 and 11 PM, often experiencing a sleep latency of 30 minutes to an hour. The bedroom is described as cool, quiet and dark. Due to back problems she avoid supine sleep but she also has shoulder pain which makes lateral sleep uncomfortable. She moves a lot. She does report vivid dreams, usually not nightmarish in character. She usually has one bathroom break. She rises around 7 AM. She wakes up spontaneously. She has not had recently severe sleep interrupting headaches but they used to come on in the 3 or 4 AM. She believes that her neck was the cause. No naps in daytime.    Sleep medical history and family sleep history:  Fibromyalgia , hypokalemia, snoring, HTN, enlarged heart/    One biological daughter with aggressive MS diagnosed age 77, now 23 -lives in [place], takes Gilen[place]. Other children healthy.    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  Katherine Pugh is a married mother of 3 adult daughters, and has 3 adopted children as well ( 2 nephews adopted after their father's death) . She is expecting her 10th grandchild to be born this August. She drinks espresso and coffee in the mornings nor soda or ice tea, she is a nontobacco user, she drinks wine and beer- 1-3 a week.   Interview history, from 09/20/2016. Katherine Pugh was referred by Dr. Lucia Gaskins for a sleep evaluation in regards to sleep related headaches. She tested positive for mild OSA with long hypoxemia and REM accentuation to an AHI of 55. We discussed today treatment, including improving nasal patency, she has not taken narcotics in weeks, she wants to loose weight further, and she is claustrophobic- should she go on CPAP is her question. She is not excessivly daytime sleepy. We agreed to give CPAP a chance, she will prep her nose.    Interval history from 10 February 2017, I meeting Katherine Pugh today for a compliance report on her CPAP use.   She has been a highly compliant CPAP user 87% average using the machine for 5 hours and 17 minutes, CPAP is at a low pressure overcoming the patient's hypercapnia.  She is using 5 cmH2O with EPR and her residual AHI is 0.7.  She does use a nasal pillow irritation at the nostril, was told by Efraim Kaufmann from [employer] that she will send her a different nasal pillow probably an extra small and a different style- e still waiting for supply.  She is also planning to go to [place] for over 4 weeks and spring like for her to take her CPAP  with her have the distilled water purchased a ahead of time I would invite her to investigate if she can actually pack her CPAP or as travel on.   08-04-2017 This patient's Baseline Polysomnogram with capnography on 08/05/2016 resulted in a diagnosis of  mild Obstructive Sleep  Apnea (OSA) AHI 12.6/hr. with REM sleep exacerbation to AHI of 55.1/hr. Low oxygen nadir (73%)  and prolonged total desaturation time below SpO2 89% of 41 min. She was titrated to CPAP and has been very compliant, has noted alertness to have improved, BP is lower, and her headaches are much better.  Review of Katherine Pugh has become a highly compliant CPAP user 100% compliance for the last 30 days prior to 18 June.  Average use of time 6 hours 56 minutes, AutoSet is used between 4 and 10 cmH2O with 3 cm EPR, AHI is 0.8.  This is an excellent resolution very good compliance and her 95th percentile pressure is at 8.3 she does not have air leaks.  She had tried the DreamWear mask but the nasal pillows irritated her cartilage part of the nasal septum.  She is now using a Radiographer, therapeutic and paykel ESON XS  Mask.   PS : She encountered many difficulties obtaining distilled water for her CPAP while in small town [place].    RV next year- 07-2017    REVIEW OF SYSTEMS: Out of a complete 14 system review of symptoms, the patient complains only of the following symptoms, none and all other reviewed systems are negative.  ESS:  6  ALLERGIES: No Known Allergies  HOME MEDICATIONS: Outpatient Medications Prior to Visit  Medication Sig Dispense Refill   Ascorbic Acid (VITAMIN C PO) Take 1 tablet by mouth daily.     aspirin 81 MG tablet Take 81 mg by mouth daily.     BENICAR HCT 40-25 MG per tablet Take 1 tablet by mouth daily.      Biotin 1000 MCG tablet Take 1,000 mcg by mouth daily.     Cholecalciferol (VITAMIN D3 PO) Take 1 tablet by mouth daily.     cloNIDine (CATAPRES) 0.1 MG tablet Take 0.1 mg by mouth 2 (two) times daily. Pt only takes when sys  blood pressure gets over 150.     Cyanocobalamin (VITAMIN B12 PO) Take 1 tablet by mouth daily.     diazepam (VALIUM) 5 MG tablet Take 1 tablet by mouth as needed.     fluticasone (FLONASE) 50 MCG/ACT nasal spray Place 2 sprays into both nostrils daily. As needed     loratadine (CLARITIN) 10 MG tablet Take 10 mg by mouth daily.     meloxicam (MOBIC) 7.5 MG tablet Take 7.5 mg by mouth daily.     PATADAY 0.2 % SOLN daily as needed. As needed     rosuvastatin (CRESTOR) 5 MG tablet Take 1 tablet by mouth daily.     valACYclovir (VALTREX) 500 MG tablet Take 500 mg by mouth as needed. Take prn as needed. Rare fever blisters     Zinc 10 MG LOZG Take 1 tablet by mouth daily.     zolpidem (AMBIEN) 10 MG tablet As needed     amphetamine-dextroamphetamine (ADDERALL) 10 MG tablet Take 10 mg by mouth daily in the afternoon. (Patient not taking: Reported on 07/15/2021)     potassium chloride (KLOR-CON) 10 MEQ tablet Take 20 mEq by mouth 2 (two) times daily. (Patient not taking: Reported on 07/15/2021)     potassium chloride (KLOR-CON) 10 MEQ tablet Take 10  mEq by mouth daily in the afternoon.     No facility-administered medications prior to visit.    PAST MEDICAL HISTORY: Past Medical History:  Diagnosis Date   Abnormal uterine bleeding    Atypical chest pain 01/05/2011   Chronic back pain    Episodic cluster headache, not intractable 07/21/2016   Fibromyalgia    Hiatal  hernia    High cholesterol    Hypertension    Leg edema-left 01/05/2011   Myalgia 07/21/2016   OSA on CPAP 08/04/2017   Palpitations-nocturnal 01/05/2011   PVC's (premature ventricular contractions)    Vasomotor rhinitis 07/21/2016    PAST SURGICAL HISTORY: Past Surgical History:  Procedure Laterality Date   ABDOMINAL HYSTERECTOMY     BACK SURGERY     CESAREAN SECTION     x 3   DILATION AND CURETTAGE OF UTERUS     HERNIA REPAIR     KNEE SURGERY Bilateral    x2   LAPAROSCOPIC CHOLECYSTECTOMY     THUMB ARTHROSCOPY      FAMILY HISTORY: Family History  Problem Relation Age of Onset   Stroke Mother    Lung cancer Father    Cancer Sister    Lung cancer Brother        has another brother with lung cancer as well.     Breast cancer Cousin    Colon cancer Other        unspecified uncle    SOCIAL HISTORY: Social History   Socioeconomic History   Marital status: Married    Spouse name: Not on file   Number of children: 6   Years of education: 12   Highest education level: Not on file  Occupational History   Occupation: Unemployed  Tobacco Use   Smoking status: Former    Packs/day: .5    Types: Cigarettes    Quit date: 01/04/1974    Years since quitting: 48.5   Smokeless tobacco: Never  Substance and Sexual Activity   Alcohol use: Yes    Comment: occasional (1-3 per wk)   Drug use: No   Sexual activity: Not on file  Other Topics Concern   Not on file  Social History Narrative   Lives at home w/ her husband   Right-handed   Caffeine: 2-3 cups of coffee   Social Determinants of Health   Financial Resource Strain: Not on file  Food Insecurity: Not on file  Transportation Needs: Not on file  Physical Activity: Not on file  Stress: Not on file  Social Connections: Not on file  Intimate Partner Violence: Not on file     PHYSICAL EXAM  Vitals:   06/28/22 0929 06/28/22 0940  BP: (!) 158/93 (!) 173/90  Pulse: 65 (!) 59  Weight: 197 lb 3.2 oz (89.4 kg)    Height: 5' 4.5" (1.638 m)     Body mass index is 33.33 kg/m.  Generalized: Well developed, in no acute distress  Cardiology: normal rate and rhythm, no murmur noted Respiratory: clear to auscultation bilaterally  Neurological examination  Mentation: Alert oriented to time, place, history taking. Follows all commands speech and language fluent Cranial nerve II-XII: Pupils were equal round reactive to light. Extraocular movements were full, visual field were full  Motor: The motor testing reveals 5 over 5 strength of all 4 extremities. Good symmetric motor tone is noted throughout.  Gait and station: Gait is normal.    DIAGNOSTIC DATA (LABS, IMAGING, TESTING) - I reviewed patient records, labs, notes, testing and  imaging myself where available.      No data to display           Lab Results  Component Value Date   WBC 12.1 (H) 12/25/2009   HGB 14.0 12/25/2009   HCT 39.9 12/25/2009   MCV 90.7 12/25/2009   PLT 160 12/25/2009      Component Value Date/Time   NA 142 01/03/2019 1629   K 3.9 01/03/2019 1629   CL 102 01/03/2019 1629   CO2 26 01/03/2019 1629   GLUCOSE 100 (H) 01/03/2019 1629   GLUCOSE 144 (H) 12/25/2009 0347   BUN 20 01/03/2019 1629   CREATININE 1.07 (H) 01/03/2019 1629   CALCIUM 9.3 01/03/2019 1629   PROT 7.1 12/24/2009 1355   ALBUMIN 3.6 12/24/2009 1355   AST 23 12/24/2009 1355   ALT 34 12/24/2009 1355   ALKPHOS 50 12/24/2009 1355   BILITOT 0.8 12/24/2009 1355   GFRNONAA 54 (L) 01/03/2019 1629   GFRAA 63 01/03/2019 1629   No results found for: "CHOL", "HDL", "LDLCALC", "LDLDIRECT", "TRIG", "CHOLHDL" No results found for: "HGBA1C" No results found for: "VITAMINB12" No results found for: "TSH"   ASSESSMENT AND PLAN 72 y.o. year old female  has a past medical history of Abnormal uterine bleeding, Atypical chest pain (01/05/2011), Chronic back pain, Episodic cluster headache, not intractable (07/21/2016), Fibromyalgia, Hiatal hernia, High cholesterol,  Hypertension, Leg edema-left (01/05/2011), Myalgia (07/21/2016), OSA on CPAP (08/04/2017), Palpitations-nocturnal (01/05/2011), PVC's (premature ventricular contractions), and Vasomotor rhinitis (07/21/2016). here with     ICD-10-CM   1. OSA on CPAP  G47.33 For home use only DME continuous positive airway pressure (CPAP)        MAHKAYLA HUBBS is doing well on CPAP therapy. Compliance report reveals excellent compliance. I have reviewed data on travel app and it looks great. She will continue discussion with [employer] about any troubleshooting concerns, as needed. Headaches are rare now that she is using CPAP. She will keep a close eye on BP at home. She was encouraged to continue using CPAP nightly and for greater than 4 hours each night. We will update supply orders as indicated. Risks of untreated sleep apnea review and education materials provided. Healthy lifestyle habits encouraged. She will follow up in 1 year, sooner if needed. She verbalizes understanding and agreement with this plan.    Orders Placed This Encounter  Procedures   For home use only DME continuous positive airway pressure (CPAP)    Supplies    Order Specific Question:   Length of Need    Answer:   Lifetime    Order Specific Question:   Patient has OSA or probable OSA    Answer:   Yes    Order Specific Question:   Is the patient currently using CPAP in the home    Answer:   Yes    Order Specific Question:   Settings    Answer:   Other see comments    Order Specific Question:   CPAP supplies needed    Answer:   Mask, headgear, cushions, filters, heated tubing and water chamber     No orders of the defined types were placed in this encounter.     Katherine Dapper, FNP-C 06/28/2022, 10:12 AM [facility] [address] [place], [place] [address] [phone]

## 2022-06-24 NOTE — Patient Instructions (Addendum)
Please continue using your CPAP regularly. While your insurance requires that you use CPAP at least 4 hours each night on 70% of the nights, I recommend, that you not skip any nights and use it throughout the night if you can. Getting used to CPAP and staying with the treatment long term does take time and patience and discipline. Untreated obstructive sleep apnea when it is moderate to severe can have an adverse impact on cardiovascular health and raise her risk for heart disease, arrhythmias, hypertension, congestive heart failure, stroke and diabetes. Untreated obstructive sleep apnea causes sleep disruption, nonrestorative sleep, and sleep deprivation. This can have an impact on your day to day functioning and cause daytime sleepiness and impairment of cognitive function, memory loss, mood disturbance, and problems focussing. Using CPAP regularly can improve these symptoms.  We will update supply orders, today. Continue to work with [employer] if you have any trouble with travel machine. You data on the app looks good. Keep an eye on you BP at home.   Follow up in 1 year

## 2022-06-28 ENCOUNTER — Encounter: Payer: Self-pay | Admitting: Family Medicine

## 2022-06-28 ENCOUNTER — Ambulatory Visit: Payer: Medicare Other | Admitting: Family Medicine

## 2022-06-28 VITALS — BP 173/90 | HR 59 | Ht 64.5 in | Wt 197.2 lb

## 2022-06-28 DIAGNOSIS — G4733 Obstructive sleep apnea (adult) (pediatric): Secondary | ICD-10-CM | POA: Diagnosis not present

## 2022-07-02 ENCOUNTER — Other Ambulatory Visit: Payer: Self-pay | Admitting: Internal Medicine

## 2022-07-02 DIAGNOSIS — M5134 Other intervertebral disc degeneration, thoracic region: Secondary | ICD-10-CM

## 2022-07-02 DIAGNOSIS — M419 Scoliosis, unspecified: Secondary | ICD-10-CM

## 2022-07-16 ENCOUNTER — Ambulatory Visit
Admission: RE | Admit: 2022-07-16 | Discharge: 2022-07-16 | Disposition: A | Discharge status: Home / Self Care | Payer: Medicare Other | Source: Ambulatory Visit | Attending: Internal Medicine | Admitting: Internal Medicine

## 2022-07-16 DIAGNOSIS — M419 Scoliosis, unspecified: Secondary | ICD-10-CM

## 2022-07-16 DIAGNOSIS — M5134 Other intervertebral disc degeneration, thoracic region: Secondary | ICD-10-CM

## 2022-07-19 ENCOUNTER — Ambulatory Visit: Payer: Self-pay | Admitting: Orthopedic Surgery

## 2022-07-27 ENCOUNTER — Other Ambulatory Visit: Payer: Self-pay | Admitting: Internal Medicine

## 2022-07-27 ENCOUNTER — Ambulatory Visit: Payer: Medicare Other | Attending: Internal Medicine | Admitting: Internal Medicine

## 2022-07-27 ENCOUNTER — Encounter: Payer: Self-pay | Admitting: Internal Medicine

## 2022-07-27 VITALS — BP 126/84 | HR 73 | Ht 64.5 in | Wt 197.8 lb

## 2022-07-27 DIAGNOSIS — R109 Unspecified abdominal pain: Secondary | ICD-10-CM

## 2022-07-27 DIAGNOSIS — R001 Bradycardia, unspecified: Secondary | ICD-10-CM | POA: Diagnosis not present

## 2022-07-27 DIAGNOSIS — R002 Palpitations: Secondary | ICD-10-CM | POA: Diagnosis not present

## 2022-07-27 NOTE — Patient Instructions (Signed)
Medication Instructions:  Your physician recommends that you continue on your current medications as directed. Please refer to the Current Medication list given to you today.  *If you need a refill on your cardiac medications before your next appointment, please call your pharmacy*   Lab Work: None ordered.  If you have labs (blood work) drawn today and your tests are completely normal, you will receive your results only by: MyChart Message (if you have MyChart) OR A paper copy in the mail If you have any lab test that is abnormal or we need to change your treatment, we will call you to review the results.   Testing/Procedures: None ordered.    Follow-Up: At [facility], you and your health needs are our priority.  As part of our continuing mission to provide you with exceptional heart care, we have created designated Provider Care Teams.  These Care Teams include your primary Cardiologist (physician) and Advanced Practice Providers (APPs -  Physician Assistants and Nurse Practitioners) who all work together to provide you with the care you need, when you need it.  We recommend signing up for the patient portal called "MyChart".  Sign up information is provided on this After Visit Summary.  MyChart is used to connect with patients for Virtual Visits (Telemedicine).  Patients are able to view lab/test results, encounter notes, upcoming appointments, etc.  Non-urgent messages can be sent to your provider as well.   To learn more about what you can do with MyChart, go to [email or web address].    Your next appointment:   12 months with Dr Klein 

## 2022-07-27 NOTE — Progress Notes (Signed)
Patient Care Team: Chilton Greathouse, MD as PCP - General (Internal Medicine)   HPI  Katherine Pugh is a 72 y.o. female seen in follow-up for hypertension and some dyspnea and atypical chest pains.  The patient denies chest pain, shortness of breath, nocturnal dyspnea, orthopnea or peripheral edema.  There have been no palpitations, lightheadedness or syncope.   The patient had an episode starting in mid May of shocks and pain throughout her body that were recurring.  They were worse while flat there was occurred while sleeping and now she is terrified to sleep.  She ended up with dyspnea on exertion, swollen digital digits.  Has had multiple evaluations for this.  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 Danielle Dess  She has treated sleep apnea.   DATE TEST EF   5/15 MYOVIEW   normal % No perfusion defects  10/20 Echo  55-65% PAsys 40mm Ao root 39 mm  11/20 CTA  No obstruction of central veins Ao Root NORMAL  6/23 Echo  60-65% Ao Root 35 PA sys 33      Date Cr K Hgb   11/23 0.9 3.9            Records and Results Reviewed   Past Medical History:  Diagnosis Date   Abnormal uterine bleeding    Atypical chest pain 01/05/2011   Chronic back pain    Episodic cluster headache, not intractable 07/21/2016   Fibromyalgia    Hiatal hernia    High cholesterol    Hypertension    Leg edema-left 01/05/2011   Myalgia 07/21/2016   OSA on CPAP 08/04/2017   Palpitations-nocturnal 01/05/2011   PVC's (premature ventricular contractions)    Vasomotor rhinitis 07/21/2016    Past Surgical History:  Procedure Laterality Date   ABDOMINAL HYSTERECTOMY     BACK SURGERY     CESAREAN SECTION     x 3   DILATION AND CURETTAGE OF UTERUS     HERNIA REPAIR     KNEE SURGERY Bilateral    x2   LAPAROSCOPIC CHOLECYSTECTOMY     THUMB ARTHROSCOPY      Current Meds  Medication Sig   Ascorbic Acid (VITAMIN C PO) Take 500 mg by mouth daily.   aspirin 81 MG tablet  Take 81 mg by mouth daily.   BENICAR HCT 40-25 MG per tablet Take 1 tablet by mouth daily.    Biotin 1000 MCG tablet Take 1,000 mcg by mouth daily.   Cholecalciferol (VITAMIN D3 PO) Take 55 mcg by mouth daily.   cloNIDine (CATAPRES) 0.1 MG tablet Take 0.1 mg by mouth 2 (two) times daily. Pt only takes when sys  blood pressure gets over 150.   Cyanocobalamin (VITAMIN B12 PO) Take 1,000 mcg by mouth daily.   diazepam (VALIUM) 5 MG tablet Take 1 tablet by mouth as needed.   fluticasone (FLONASE) 50 MCG/ACT nasal spray Place 2 sprays into both nostrils daily. As needed   loratadine (CLARITIN) 10 MG tablet Take 10 mg by mouth daily.   meloxicam (MOBIC) 7.5 MG tablet Take 7.5 mg by mouth as needed.   PATADAY 0.2 % SOLN daily as needed. As needed   potassium chloride (KLOR-CON) 10 MEQ tablet Take 40 mEq by mouth daily.   rosuvastatin (CRESTOR) 5 MG tablet Take 1 tablet by mouth daily.   traMADol (ULTRAM) 50 MG tablet 50 mg. Every 8 hours as needed   valACYclovir (VALTREX) 500 MG tablet Take  500 mg by mouth as needed. Take prn as needed. Rare fever blisters   zolpidem (AMBIEN) 10 MG tablet As needed    No Known Allergies    Review of Systems negative except from HPI and PMH  Physical Exam BP 126/84   Pulse 73   Ht 5' 4.5" (1.638 m)   Wt 197 lb 12.8 oz (89.7 kg)   SpO2 96%   BMI 33.43 kg/m  Well developed and nourished in no acute distress HENT normal Neck supple with JVP-  flat   Clear Regular rate and rhythm, no murmurs or gallops Abd-soft with active BS No Clubbing cyanosis edema Skin-warm and dry A & Oriented  Grossly normal sensory and motor function  ECG sinus at 73 Intervals 01/[birth year]/1938 CrCl cannot be calculated (Patient's most recent lab result is older than the maximum 21 days allowed.).   Assessment and  Plan   FHx of clotting   Bradycardia    Hypertension  Pulmonary hypertension  Aortic root dilatation-erroneously reported   ciguatera Poisoning  --presumptive   Blood pressure and heart rates have been surprisingly stable.  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  Repeat imaging is pending.  The story of her fish poisoning, presumed ciguatera Poisoning, a diagnosis made by Dr. Danielle Dess, his sobering.  Things are better but only gradually  Pulmonary hyper tension measurements were better on the last echo

## 2022-08-02 ENCOUNTER — Ambulatory Visit
Admission: RE | Admit: 2022-08-02 | Discharge: 2022-08-02 | Disposition: A | Discharge status: Home / Self Care | Payer: Medicare Other | Source: Ambulatory Visit | Attending: Internal Medicine | Admitting: Internal Medicine

## 2022-08-02 DIAGNOSIS — R109 Unspecified abdominal pain: Secondary | ICD-10-CM

## 2022-08-02 MED ORDER — IOPAMIDOL (ISOVUE-300) INJECTION 61%
100.0000 mL | Freq: Once | INTRAVENOUS | Status: AC | PRN
  Administered 2022-08-02: 100 mL via INTRAVENOUS

## 2023-01-27 ENCOUNTER — Other Ambulatory Visit: Payer: Self-pay | Admitting: Internal Medicine

## 2023-01-27 DIAGNOSIS — R0789 Other chest pain: Secondary | ICD-10-CM

## 2023-02-04 ENCOUNTER — Ambulatory Visit
Admission: RE | Admit: 2023-02-04 | Discharge: 2023-02-04 | Disposition: A | Discharge status: Home / Self Care | Payer: Medicare Other | Source: Ambulatory Visit | Attending: Internal Medicine | Admitting: Internal Medicine

## 2023-02-04 DIAGNOSIS — R0789 Other chest pain: Secondary | ICD-10-CM

## 2023-04-14 DIAGNOSIS — G4733 Obstructive sleep apnea (adult) (pediatric): Secondary | ICD-10-CM | POA: Diagnosis not present

## 2023-04-19 DIAGNOSIS — K080 Exfoliation of teeth due to systemic causes: Secondary | ICD-10-CM | POA: Diagnosis not present

## 2023-05-12 DIAGNOSIS — G4733 Obstructive sleep apnea (adult) (pediatric): Secondary | ICD-10-CM | POA: Diagnosis not present

## 2023-05-20 DIAGNOSIS — L304 Erythema intertrigo: Secondary | ICD-10-CM | POA: Diagnosis not present

## 2023-05-20 DIAGNOSIS — L570 Actinic keratosis: Secondary | ICD-10-CM | POA: Diagnosis not present

## 2023-05-20 DIAGNOSIS — L821 Other seborrheic keratosis: Secondary | ICD-10-CM | POA: Diagnosis not present

## 2023-05-20 DIAGNOSIS — L820 Inflamed seborrheic keratosis: Secondary | ICD-10-CM | POA: Diagnosis not present

## 2023-05-20 DIAGNOSIS — D225 Melanocytic nevi of trunk: Secondary | ICD-10-CM | POA: Diagnosis not present

## 2023-05-20 DIAGNOSIS — Z85828 Personal history of other malignant neoplasm of skin: Secondary | ICD-10-CM | POA: Diagnosis not present

## 2023-06-12 DIAGNOSIS — G4733 Obstructive sleep apnea (adult) (pediatric): Secondary | ICD-10-CM | POA: Diagnosis not present

## 2023-06-27 DIAGNOSIS — H5213 Myopia, bilateral: Secondary | ICD-10-CM | POA: Diagnosis not present

## 2023-06-28 NOTE — Progress Notes (Unsigned)
 PATIENT: Katherine Pugh DOB: March 08, 1950  REASON FOR VISIT: follow up HISTORY FROM: patient  No chief complaint on file.    HISTORY OF PRESENT ILLNESS:  06/28/23 ALL:  Katherine Pugh returns for follow up for OSA on CPAP.   Set up 2020???  06/28/2022 ALL:  Katherine Pugh returns for follow up for OSA on CPAP. She reports doing well with home CPAP. She is not tolerating travel machine as well. Nasal pillow mask causes nasal tenderness. She reports travel machine makes loud noise. DME did let her know this was a common finding with her travel machine. We have no documentation of travel machine orders with pressure settings. She is unsure what her pressure settings are on travel machine.   BP is usually well managed at home (less than 140 sys). She reports Benicar was stopped by cardiology for adrenal gland testing. Could not complete testing due to hypertension. Benicar was restarted. She was started on clonidine if BP > 150. She recently completed a prednisone taper back for back pain. She has not been able to work out recently.     06/23/2021 ALL: Katherine Pugh is a 73 y.o. female here today for follow up for OSA on CPAP.  She is using CPAP nightly for about 8 hours. She denies concerns with machine or supplies. She does report having a rough area of skin under her nose. She is very happy with her nasal pillow mask and does not wish to change. BP is usually well managed at home. She continues Benicar as prescribed. Reading was reportedly 130's/80's at recent visit with PCP.     08/15/19 ALL (Mychart):  Katherine Pugh is a 73 y.o. female here today for follow up for OSA on CPAP. She is doing very well. She returns for annual follow up. She is using CPAP nightly. She rarely has headaches. She denies any concerns with therapy with the exception of intermittent tenderness of nares with nasal pillow. She has tried multiple masks and feels this is the best fit.    Compliance report dated 07/16/2019 through  08/14/2019 reveals that she used CPAP 30 of the past 30 days for compliance of 100%.  She used CPAP greater than 4 hours 30 of the past 30 days for compliance of 100%.  Average usage was 7 hours and 9 minutes.  Residual AHI was 1.0 on 4 to 10 cm of water and an EPR of 3.  There was no significant leak noted.   HISTORY: (copied from Dr Dohmeier's note on 08/08/2018)    Katherine Pugh is a 73 y.o. female, was seen in a referralit from Dr. Tresia Fruit for a sleep evaluation in regards to sleep related headaches.    Katherine Pugh reports that she had once been tested in the sleep lab in [place], [place] . At the time she did not receive a call back, no treatment was initiated and she assumed that no abnormalities were found. She had presented to Dr. Tresia Fruit for evaluation of headaches that begun about 14 months ago. She describes her headaches as waking up with them in the middle of the night they wake her out of sleep. They're severe, sudden and has the character of an attack that feels like a stroke. Her neck would be tense, it happens up to twice a week. It does not happen in the first 2 hours of sleep but more towards the morning hours. The headache is global does not involve one side of the head or  1 region only and can last up to 20 minutes. She avoids movement but having the headaches and had no nausea or vomiting, no light or sound sensitivity associated with it. She sometimes has numbness and left arm pain. About 6 years ago she noted numbness and facial weakness on the left angle of the mouth.     Chief complaint according to patient : " I need to know if my headaches are related to sleep "   Sleep habits are as follows: She goes to bed between 10 and 11 PM, often experiencing a sleep latency of 30 minutes to an hour. The bedroom is described as cool, quiet and dark. Due to back problems she avoid supine sleep but she also has shoulder pain which makes lateral sleep uncomfortable. She moves a lot.  She does report vivid dreams, usually not nightmarish in character. She usually has one bathroom break. She rises around 7 AM. She wakes up spontaneously. She has not had recently severe sleep interrupting headaches but they used to come on in the 3 or 4 AM. She believes that her neck was the cause. No naps in daytime.    Sleep medical history and family sleep history:  Fibromyalgia , hypokalemia, snoring, HTN, enlarged heart/    One biological daughter with aggressive MS diagnosed age 63, now 72 -lives in [place], takes [place]. Other children healthy.    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  Katherine Pugh is a married mother of 3 adult daughters, and has 3 adopted children as well ( 2 nephews adopted after their father's death) . She is expecting her 10th grandchild to be born this August. She drinks espresso and coffee in the mornings nor soda or ice tea, she is a nontobacco user, she drinks wine and beer- 1-3 a week.   Interview history, from 09/20/2016. Katherine Pugh was referred by Dr. Tresia Fruit for a sleep evaluation in regards to sleep related headaches. She tested positive for mild OSA with long hypoxemia and REM accentuation to an AHI of 55. We discussed today treatment, including improving nasal patency, she has not taken narcotics in weeks, she wants to loose weight further, and she is claustrophobic- should she go on CPAP is her question. She is not excessivly daytime sleepy. We agreed to give CPAP a chance, she will prep her nose.    Interval history from 10 February 2017, I meeting Katherine Pugh today for a compliance report on her CPAP use.  She has been a highly compliant CPAP user 87% average using the machine for 5 hours and 17 minutes, CPAP is at a low pressure overcoming the patient's hypercapnia.  She is using 5 cmH2O with EPR and her residual AHI is 0.7.  She does use a nasal pillow irritation at the nostril, was told by Lovett Ruck from  [employer] that she will send her a different nasal pillow probably an extra small and a different style- e still waiting for supply.  She is also planning to go to [place] for over 4 weeks and spring like for her to take her CPAP with her have the distilled water purchased a ahead of time I would invite her to investigate if she can actually pack her CPAP or as travel on.   08-04-2017 This patient's Baseline Polysomnogram with capnography on 08/05/2016 resulted in a diagnosis of  mild Obstructive Sleep  Apnea (OSA) AHI 12.6/hr. with REM sleep exacerbation to AHI of 55.1/hr.  Low oxygen nadir (73%)  and prolonged total desaturation time below SpO2 89% of 41 min. She was titrated to CPAP and has been very compliant, has noted alertness to have improved, BP is lower, and her headaches are much better.  Katherine Pugh has become a highly compliant CPAP user 100% compliance for the last 30 days prior to 18 June.  Average use of time 6 hours 56 minutes, AutoSet is used between 4 and 10 cmH2O with 3 cm EPR, AHI is 0.8.  This is an excellent resolution very good compliance and her 95th percentile pressure is at 8.3 she does not have air leaks.  She had tried the DreamWear mask but the nasal pillows irritated her cartilage part of the nasal septum.  She is now using a Radiographer, therapeutic and paykel ESON XS  Mask.   PS : She encountered many difficulties obtaining distilled water for her CPAP while in [place] town [place].    RV next year- 07-2017    Katherine OF SYSTEMS: Out of a complete 14 system Katherine of symptoms, the patient complains only of the following symptoms, none and all other reviewed systems are negative.  ESS: 6  ALLERGIES: Allergies  Allergen Reactions   Hydralazine     HOME MEDICATIONS: Outpatient Medications Prior to Visit  Medication Sig Dispense Refill   amphetamine-dextroamphetamine (ADDERALL) 10 MG tablet Take 10 mg by mouth daily in the afternoon.     Ascorbic Acid (VITAMIN C PO) Take 500 mg  by mouth daily.     aspirin 81 MG tablet Take 81 mg by mouth daily.     BENICAR HCT 40-25 MG per tablet Take 1 tablet by mouth daily.      Biotin 1000 MCG tablet Take 1,000 mcg by mouth daily.     Cholecalciferol (VITAMIN D3 PO) Take 55 mcg by mouth daily.     cloNIDine (CATAPRES) 0.1 MG tablet Take 0.1 mg by mouth 2 (two) times daily. Pt only takes when sys  blood pressure gets over 150.     Cyanocobalamin (VITAMIN B12 PO) Take 1,000 mcg by mouth daily.     diazepam (VALIUM) 5 MG tablet Take 1 tablet by mouth as needed.     fluticasone (FLONASE) 50 MCG/ACT nasal spray Place 2 sprays into both nostrils daily. As needed     loratadine (CLARITIN) 10 MG tablet Take 10 mg by mouth daily.     meloxicam (MOBIC) 7.5 MG tablet Take 7.5 mg by mouth as needed.     PATADAY 0.2 % SOLN daily as needed. As needed     potassium chloride (KLOR-CON) 10 MEQ tablet Take 40 mEq by mouth daily.     rosuvastatin (CRESTOR) 5 MG tablet Take 1 tablet by mouth daily.     traMADol (ULTRAM) 50 MG tablet 50 mg. Every 8 hours as needed     valACYclovir (VALTREX) 500 MG tablet Take 500 mg by mouth as needed. Take prn as needed. Rare fever blisters     zolpidem (AMBIEN) 10 MG tablet As needed     No facility-administered medications prior to visit.    PAST MEDICAL HISTORY: Past Medical History:  Diagnosis Date   Abnormal uterine bleeding    Atypical chest pain 01/05/2011   Chronic back pain    Episodic cluster headache, not intractable 07/21/2016   Fibromyalgia    Hiatal hernia    High cholesterol    Hypertension    Leg edema-left 01/05/2011   Myalgia 07/21/2016   OSA  on CPAP 08/04/2017   Palpitations-nocturnal 01/05/2011   PVC's (premature ventricular contractions)    Vasomotor rhinitis 07/21/2016    PAST SURGICAL HISTORY: Past Surgical History:  Procedure Laterality Date   ABDOMINAL HYSTERECTOMY     BACK SURGERY     CESAREAN SECTION     x 3   DILATION AND CURETTAGE OF UTERUS     HERNIA REPAIR     KNEE  SURGERY Bilateral    x2   LAPAROSCOPIC CHOLECYSTECTOMY     THUMB ARTHROSCOPY      FAMILY HISTORY: Family History  Problem Relation Age of Onset   Stroke Mother    Lung cancer Father    Cancer Sister    Lung cancer Brother        has another brother with lung cancer as well.     Breast cancer Cousin    Colon cancer Other        unspecified uncle    SOCIAL HISTORY: Social History   Socioeconomic History   Marital status: Married    Spouse name: Not on file   Number of children: 6   Years of education: 12   Highest education level: Not on file  Occupational History   Occupation: Unemployed  Tobacco Use   Smoking status: Former    Current packs/day: 0.00    Types: Cigarettes    Quit date: 01/04/1974    Years since quitting: 49.5   Smokeless tobacco: Never  Substance and Sexual Activity   Alcohol use: Yes    Comment: occasional (1-3 per wk)   Drug use: No   Sexual activity: Not on file  Other Topics Concern   Not on file  Social History Narrative   Lives at home w/ her husband   Right-handed   Caffeine: 2-3 cups of coffee   Social Drivers of Corporate investment banker Strain: Not on file  Food Insecurity: Not on file  Transportation Needs: Not on file  Physical Activity: Not on file  Stress: Not on file  Social Connections: Not on file  Intimate Partner Violence: Not on file     PHYSICAL EXAM  There were no vitals filed for this visit.   There is no height or weight on file to calculate BMI.  Generalized: Well developed, in no acute distress  Cardiology: normal rate and rhythm, no murmur noted Respiratory: clear to auscultation bilaterally  Neurological examination  Mentation: Alert oriented to time, place, history taking. Follows all commands speech and language fluent Cranial nerve II-XII: Pupils were equal round reactive to light. Extraocular movements were full, visual field were full  Motor: The motor testing reveals 5 over 5 strength of all  4 extremities. Good symmetric motor tone is noted throughout.  Gait and station: Gait is normal.    DIAGNOSTIC DATA (LABS, IMAGING, TESTING) - I reviewed patient records, labs, notes, testing and imaging myself where available.      No data to display           Lab Results  Component Value Date   WBC 12.1 (H) 12/25/2009   HGB 14.0 12/25/2009   HCT 39.9 12/25/2009   MCV 90.7 12/25/2009   PLT 160 12/25/2009      Component Value Date/Time   NA 142 01/03/2019 1629   K 3.9 01/03/2019 1629   CL 102 01/03/2019 1629   CO2 26 01/03/2019 1629   GLUCOSE 100 (H) 01/03/2019 1629   GLUCOSE 144 (H) 12/25/2009 0347   BUN 20 01/03/2019  1629   CREATININE 1.07 (H) 01/03/2019 1629   CALCIUM 9.3 01/03/2019 1629   PROT 7.1 12/24/2009 1355   ALBUMIN 3.6 12/24/2009 1355   AST 23 12/24/2009 1355   ALT 34 12/24/2009 1355   ALKPHOS 50 12/24/2009 1355   BILITOT 0.8 12/24/2009 1355   GFRNONAA 54 (L) 01/03/2019 1629   GFRAA 63 01/03/2019 1629   No results found for: "CHOL", "HDL", "LDLCALC", "LDLDIRECT", "TRIG", "CHOLHDL" No results found for: "HGBA1C" No results found for: "VITAMINB12" No results found for: "TSH"   ASSESSMENT AND PLAN 73 y.o. year old female  has a past medical history of Abnormal uterine bleeding, Atypical chest pain (01/05/2011), Chronic back pain, Episodic cluster headache, not intractable (07/21/2016), Fibromyalgia, Hiatal hernia, High cholesterol, Hypertension, Leg edema-left (01/05/2011), Myalgia (07/21/2016), OSA on CPAP (08/04/2017), Palpitations-nocturnal (01/05/2011), PVC's (premature ventricular contractions), and Vasomotor rhinitis (07/21/2016). here with   No diagnosis found.     Katherine Pugh is doing well on CPAP therapy. Compliance report reveals excellent compliance. I have reviewed data on travel app and it looks great. She will continue discussion with [employer] about any troubleshooting concerns, as needed. Headaches are rare now that she is using CPAP. She will  keep a close eye on BP at home. She was encouraged to continue using CPAP nightly and for greater than 4 hours each night. We will update supply orders as indicated. Risks of untreated sleep apnea Katherine and education materials provided. Healthy lifestyle habits encouraged. She will follow up in 1 year, sooner if needed. She verbalizes understanding and agreement with this plan.    No orders of the defined types were placed in this encounter.    No orders of the defined types were placed in this encounter.     Terrilyn Fick, FNP-C 06/28/2023, 9:06 AM [facility] [address], [address] [place], [place] [address] [phone]

## 2023-06-28 NOTE — Patient Instructions (Incomplete)

## 2023-06-29 DIAGNOSIS — Z79899 Other long term (current) drug therapy: Secondary | ICD-10-CM | POA: Diagnosis not present

## 2023-06-29 DIAGNOSIS — I10 Essential (primary) hypertension: Secondary | ICD-10-CM | POA: Diagnosis not present

## 2023-06-29 DIAGNOSIS — E278 Other specified disorders of adrenal gland: Secondary | ICD-10-CM | POA: Diagnosis not present

## 2023-06-29 NOTE — Progress Notes (Unsigned)
 Katherine Pugh

## 2023-06-30 ENCOUNTER — Telehealth: Payer: Self-pay | Admitting: Family Medicine

## 2023-06-30 ENCOUNTER — Encounter: Payer: Self-pay | Admitting: Family Medicine

## 2023-06-30 ENCOUNTER — Ambulatory Visit: Payer: Medicare Other | Admitting: Family Medicine

## 2023-06-30 VITALS — BP 140/85 | HR 70 | Ht 64.5 in | Wt 196.5 lb

## 2023-06-30 DIAGNOSIS — G4733 Obstructive sleep apnea (adult) (pediatric): Secondary | ICD-10-CM

## 2023-06-30 NOTE — Telephone Encounter (Signed)
 Pt requested her next yer f/u be with Dr Albertina Hugger since she saw Amy, NP today.

## 2023-07-22 ENCOUNTER — Ambulatory Visit: Admitting: Internal Medicine

## 2023-07-22 DIAGNOSIS — G4733 Obstructive sleep apnea (adult) (pediatric): Secondary | ICD-10-CM | POA: Diagnosis not present

## 2023-07-25 DIAGNOSIS — E785 Hyperlipidemia, unspecified: Secondary | ICD-10-CM | POA: Diagnosis not present

## 2023-07-25 DIAGNOSIS — I10 Essential (primary) hypertension: Secondary | ICD-10-CM | POA: Diagnosis not present

## 2023-07-27 DIAGNOSIS — R7301 Impaired fasting glucose: Secondary | ICD-10-CM | POA: Diagnosis not present

## 2023-07-27 DIAGNOSIS — I10 Essential (primary) hypertension: Secondary | ICD-10-CM | POA: Diagnosis not present

## 2023-07-27 DIAGNOSIS — E278 Other specified disorders of adrenal gland: Secondary | ICD-10-CM | POA: Diagnosis not present

## 2023-07-29 ENCOUNTER — Ambulatory Visit: Payer: Self-pay

## 2023-09-06 ENCOUNTER — Ambulatory Visit: Admitting: Internal Medicine

## 2023-09-18 NOTE — Progress Notes (Unsigned)
  Electrophysiology Office Note:   Date:  09/22/2023  ID:  Katherine Pugh, DOB 05/10/1950, MRN [id number]  Primary Cardiologist: None Primary Heart Failure: None Electrophysiologist: None      History of Present Illness:   Katherine Pugh is a 73 y.o. 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   Since last being seen in our clinic the patient reports doing well overall. She is working out regularly > walking and rides the [employer] bike.  She has lost some weight.  She reports longstanding hx of difficulty managing her blood pressure > intolerant of some agents due to slow HR and feeling poorly. 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  Most recently, BP has been largely controlled.  No issues with lightheadeness / dizziness.     She denies chest pain, palpitations, dyspnea, PND, orthopnea, nausea, vomiting, dizziness, syncope, edema, weight gain, or early satiety.   Review of systems complete and found to be negative unless listed in HPI.   EP Information / Studies Reviewed:    EKG is ordered today. Personal review as below.  EKG Interpretation Date/Time:  Thursday September 22 2023 07:52:53 EDT Ventricular Rate:  70 PR Interval:  146 QRS Duration:  78 QT Interval:  392 QTC Calculation: 423 R Axis:   6  Text Interpretation: Normal sinus rhythm with sinus arrhythmia Normal ECG Confirmed by Aniceto Jarvis ([id number]) on 09/22/2023 8:06:23 AM   Risk Assessment/Calculations:              Physical Exam:   VS:  BP 128/84   Pulse 70   Ht 5' 4.5 (1.638 m)   Wt 185 lb 3.2 oz (84 kg)   SpO2 96%   BMI 31.30 kg/m    Wt Readings from Last 3 Encounters:  09/22/23 185 lb 3.2 oz (84 kg)  06/30/23 196 lb 8 oz (89.1 kg)  07/27/22 197 lb 12.8 oz (89.7 kg)     GEN: Well nourished, well developed in no acute  distress NECK: No JVD; No carotid bruits CARDIAC: Regular rate and rhythm, no murmurs, rubs, gallops RESPIRATORY:  Clear to auscultation without rales, wheezing or rhonchi  ABDOMEN: Soft, non-tender, non-distended EXTREMITIES:  No edema; No deformity   ASSESSMENT AND PLAN:    Bradycardia  Palpitations -EKG with NSR    -avoid AV nodal blockers  -no significant palpitation burden    Hypertension  -mildly elevated initially in clinic on arrival, repeat check personally and wnl   Follow up with Dr. Wonda.  She indicates he is her husbands physician and with Dr. Celine retirement she asked him to take her as a patient. Will transition her to Cardiology.  She understands she can return back to EP at any time if new needs arise.   Signed, Jarvis Aniceto, NP-C, AGACNP-BC [facility] - Electrophysiology  09/22/2023, 11:11 AM

## 2023-09-22 ENCOUNTER — Ambulatory Visit: Attending: Pulmonary Disease | Admitting: Pulmonary Disease

## 2023-09-22 ENCOUNTER — Encounter: Payer: Self-pay | Admitting: Pulmonary Disease

## 2023-09-22 VITALS — BP 128/84 | HR 70 | Ht 64.5 in | Wt 185.2 lb

## 2023-09-22 DIAGNOSIS — R002 Palpitations: Secondary | ICD-10-CM

## 2023-09-22 DIAGNOSIS — R001 Bradycardia, unspecified: Secondary | ICD-10-CM

## 2023-09-22 NOTE — Patient Instructions (Signed)
 Medication Instructions:  Your physician recommends that you continue on your current medications as directed. Please refer to the Current Medication list given to you today.  *If you need a refill on your cardiac medications before your next appointment, please call your pharmacy*  Lab Work: None ordered If you have labs (blood work) drawn today and your tests are completely normal, you will receive your results only by: MyChart Message (if you have MyChart) OR A paper copy in the mail If you have any lab test that is abnormal or we need to change your treatment, we will call you to review the results.  Follow-Up: At [facility], you and your health needs are our priority.  As part of our continuing mission to provide you with exceptional heart care, our providers are all part of one team.  This team includes your primary Cardiologist (physician) and Advanced Practice Providers or APPs (Physician Assistants and Nurse Practitioners) who all work together to provide you with the care you need, when you need it.  Your next appointment:   4 month(s)  Provider:   Dr Wonda

## 2023-10-05 DIAGNOSIS — H57813 Brow ptosis, bilateral: Secondary | ICD-10-CM | POA: Diagnosis not present

## 2023-10-05 DIAGNOSIS — H02831 Dermatochalasis of right upper eyelid: Secondary | ICD-10-CM | POA: Diagnosis not present

## 2023-10-05 DIAGNOSIS — H02834 Dermatochalasis of left upper eyelid: Secondary | ICD-10-CM | POA: Diagnosis not present

## 2023-10-12 DIAGNOSIS — K080 Exfoliation of teeth due to systemic causes: Secondary | ICD-10-CM | POA: Diagnosis not present

## 2023-10-21 DIAGNOSIS — G4733 Obstructive sleep apnea (adult) (pediatric): Secondary | ICD-10-CM | POA: Diagnosis not present

## 2023-10-22 DIAGNOSIS — G4733 Obstructive sleep apnea (adult) (pediatric): Secondary | ICD-10-CM | POA: Diagnosis not present

## 2023-10-28 DIAGNOSIS — G4733 Obstructive sleep apnea (adult) (pediatric): Secondary | ICD-10-CM | POA: Diagnosis not present

## 2023-11-19 DIAGNOSIS — Z23 Encounter for immunization: Secondary | ICD-10-CM | POA: Diagnosis not present

## 2023-12-16 DIAGNOSIS — Z1231 Encounter for screening mammogram for malignant neoplasm of breast: Secondary | ICD-10-CM | POA: Diagnosis not present

## 2024-01-22 NOTE — Progress Notes (Unsigned)
 Cardiology Office Note:    Date:  01/23/2024   ID:  SHARLOT STURKEY, DOB [birth year], MRN [id number]  PCP:  Janey Santos, MD   [facility] Providers Cardiologist:  Ozell Fell, MD     Referring MD: Janey Santos, MD   Chief Complaint  Patient presents with   Palpitations    History of Present Illness:    Katherine Pugh is a 73 y.o. female with a hx of HTN, heart palpitations, pulmonary HTN, and obstructive sleep apnea, presenting for follow-up evaluation. She has been followed by Dr Fernande and I will be assuming her care moving forward in his retirement.   The patient had a normal echo in 2023 with LVEF 60-65% with no significant valvular disease. She has longstanding intermittent heart palpitations. She reports no recent problems or exacerbation of these symptoms. She has developed bradycardia over recent years but this has improved as well.  Recent EKG's reviewed with HR ranging 62-72 bpm over the past 2 years.  She feels well and has no chest pain, chest pressure, or shortness of breath.  She has not had any recent problems with heart palpitations.  She has not required any as needed clonidine and reports that her home blood pressures have been very well-controlled on Benicar/HCT.   Current Medications: Current Meds  Medication Sig   Ascorbic Acid (VITAMIN C PO) Take 500 mg by mouth daily.   aspirin 81 MG tablet Take 81 mg by mouth daily.   BENICAR HCT 40-25 MG per tablet Take 1 tablet by mouth daily.    Biotin 1000 MCG tablet Take 1,000 mcg by mouth daily.   Cholecalciferol (VITAMIN D3 PO) Take 55 mcg by mouth daily.   cloNIDine (CATAPRES) 0.1 MG tablet Take 0.1 mg by mouth 2 (two) times daily. Pt only takes when sys  blood pressure gets over 150. (Patient taking differently: Take 0.1 mg by mouth as needed. Pt only takes when sys  blood pressure gets over 150.)   Cyanocobalamin (VITAMIN B12 PO) Take 1,000 mcg by mouth daily.   diazepam (VALIUM) 5 MG tablet Take  1 tablet by mouth as needed.   Estradiol 10 MCG TABS vaginal tablet Place 1 tablet vaginally once a week.   fluticasone (FLONASE) 50 MCG/ACT nasal spray Place 2 sprays into both nostrils daily. As needed (Patient taking differently: Place 2 sprays into both nostrils daily.)   loratadine (CLARITIN) 10 MG tablet Take 10 mg by mouth daily.   meloxicam (MOBIC) 7.5 MG tablet Take 7.5 mg by mouth as needed.   PATADAY 0.2 % SOLN daily as needed. As needed   potassium chloride (KLOR-CON) 10 MEQ tablet Take 40 mEq by mouth daily.   rosuvastatin (CRESTOR) 5 MG tablet Take 1 tablet by mouth daily.   valACYclovir (VALTREX) 500 MG tablet Take 500 mg by mouth as needed. Take prn as needed. Rare fever blisters   zolpidem (AMBIEN) 10 MG tablet As needed     Allergies:   Hydralazine   ROS:   Please see the history of present illness.    All other systems reviewed and are negative.  EKGs/Labs/Other Studies Reviewed:    The following studies were reviewed today: Cardiac Studies & Procedures   ______________________________________________________________________________________________     ECHOCARDIOGRAM  ECHOCARDIOGRAM COMPLETE 08/05/2021  Narrative ECHOCARDIOGRAM REPORT    Patient Name:   SERAH NICOLETTI Date of Exam: 08/05/2021 Medical Rec #:  [id number]      Height:       64.5  in Accession #:    [id number]     Weight:       186.2 lb Date of Birth:  03-Apr-1950     BSA:          1.908 m Patient Age:    70 years       BP:           144/88 mmHg Patient Gender: F              HR:           64 bpm. Exam Location:  [address]  Procedure: 2D Echo, Cardiac Doppler and Color Doppler  Indications:    I77.819 Aortic root dilation  History:        Patient has prior history of Echocardiogram examinations, most recent 12/12/2018. Arrythmias:PVC, Signs/Symptoms:Chest Pain; Risk Factors:Hypertension, Dyslipidemia and Sleep Apnea. Left leg edema. Palpitations. Fibromyalgia.  Bradycardia.  Sonographer:    Jon Hacker RCS Referring Phys: [phone] ELSPETH JAYSON SAGE  IMPRESSIONS   1. Left ventricular ejection fraction, by estimation, is 60 to 65%. The left ventricle has normal function. The left ventricle has no regional wall motion abnormalities. Left ventricular diastolic parameters were normal. 2. Right ventricular systolic function is normal. The right ventricular size is normal. There is normal pulmonary artery systolic pressure. 3. The mitral valve is normal in structure. Trivial mitral valve regurgitation. No evidence of mitral stenosis. 4. The aortic valve is tricuspid. Aortic valve regurgitation is not visualized. No aortic stenosis is present. 5. The inferior vena cava is normal in size with greater than 50% respiratory variability, suggesting right atrial pressure of 3 mmHg.  Comparison(s): Changes from prior study are noted. Prior ascending aorta measured 39 mm, measured 35 mm on current study. This is likely due to angle of image differences between studies.  Conclusion(s)/Recommendation(s): Otherwise normal echocardiogram, with minor abnormalities described in the report.  FINDINGS Left Ventricle: Left ventricular ejection fraction, by estimation, is 60 to 65%. The left ventricle has normal function. The left ventricle has no regional wall motion abnormalities. The left ventricular internal cavity size was normal in size. There is no left ventricular hypertrophy. Left ventricular diastolic parameters were normal.  Right Ventricle: The right ventricular size is normal. No increase in right ventricular wall thickness. Right ventricular systolic function is normal. There is normal pulmonary artery systolic pressure. The tricuspid regurgitant velocity is 2.72 m/s, and with an assumed right atrial pressure of 3 mmHg, the estimated right ventricular systolic pressure is 32.6 mmHg.  Left Atrium: Left atrial size was normal in size.  Right Atrium: Right atrial  size was normal in size.  Pericardium: There is no evidence of pericardial effusion.  Mitral Valve: The mitral valve is normal in structure. Trivial mitral valve regurgitation. No evidence of mitral valve stenosis.  Tricuspid Valve: The tricuspid valve is normal in structure. Tricuspid valve regurgitation is mild . No evidence of tricuspid stenosis.  Aortic Valve: The aortic valve is tricuspid. Aortic valve regurgitation is not visualized. No aortic stenosis is present.  Pulmonic Valve: The pulmonic valve was grossly normal. Pulmonic valve regurgitation is trivial. No evidence of pulmonic stenosis.  Aorta: The aortic root, ascending aorta, aortic arch and descending aorta are all structurally normal, with no evidence of dilitation or obstruction.  Venous: The inferior vena cava is normal in size with greater than 50% respiratory variability, suggesting right atrial pressure of 3 mmHg.  IAS/Shunts: The atrial septum is grossly normal.   LEFT VENTRICLE PLAX 2D LVIDd:  4.80 cm   Diastology LVIDs:         2.90 cm   LV e' medial:    6.89 cm/s LV PW:         0.90 cm   LV E/e' medial:  13.2 LV IVS:        1.20 cm   LV e' lateral:   11.20 cm/s LVOT diam:     1.95 cm   LV E/e' lateral: 8.1 LV SV:         81 LV SV Index:   42 LVOT Area:     2.99 cm   RIGHT VENTRICLE RV Basal diam:  2.90 cm RV S prime:     12.20 cm/s TAPSE (M-mode): 2.3 cm RVSP:           32.6 mmHg  LEFT ATRIUM             Index        RIGHT ATRIUM           Index LA diam:        3.20 cm 1.68 cm/m   RA Pressure: 3.00 mmHg LA Vol (A2C):   54.0 ml 28.30 ml/m  RA Area:     15.60 cm LA Vol (A4C):   45.3 ml 23.74 ml/m  RA Volume:   36.00 ml  18.87 ml/m LA Biplane Vol: 49.5 ml 25.94 ml/m AORTIC VALVE LVOT Vmax:   124.00 cm/s LVOT Vmean:  69.900 cm/s LVOT VTI:    0.270 m  AORTA Ao Root diam: 3.10 cm Ao Asc diam:  3.50 cm  MITRAL VALVE               TRICUSPID VALVE MV Area (PHT): 3.21 cm    TR Peak  grad:   29.6 mmHg MV Decel Time: 236 msec    TR Vmax:        272.00 cm/s MV E velocity: 90.70 cm/s  Estimated RAP:  3.00 mmHg MV A velocity: 79.70 cm/s  RVSP:           32.6 mmHg MV E/A ratio:  1.14 SHUNTS Systemic VTI:  0.27 m Systemic Diam: 1.95 cm  Shelda Bruckner MD Electronically signed by Shelda Bruckner MD Signature Date/Time: 08/05/2021/2:33:43 PM    Final          ______________________________________________________________________________________________      EKG:        Recent Labs: No results found for requested labs within last 365 days.  Recent Lipid Panel No results found for: CHOL, TRIG, HDL, CHOLHDL, VLDL, LDLCALC, LDLDIRECT   Risk Assessment/Calculations:                Physical Exam:    VS:  BP 133/84   Pulse 72   Ht 5' 4.5 (1.638 m)   Wt 176 lb 3.2 oz (79.9 kg)   SpO2 96%   BMI 29.78 kg/m     Wt Readings from Last 3 Encounters:  01/23/24 176 lb 3.2 oz (79.9 kg)  09/22/23 185 lb 3.2 oz (84 kg)  06/30/23 196 lb 8 oz (89.1 kg)     GEN:  Well nourished, well developed in no acute distress HEENT: Normal NECK: No JVD; No carotid bruits LYMPHATICS: No lymphadenopathy CARDIAC: RRR, no murmurs, rubs, gallops RESPIRATORY:  Clear to auscultation without rales, wheezing or rhonchi  ABDOMEN: Soft, non-tender, non-distended MUSCULOSKELETAL:  No edema; No deformity  SKIN: Warm and dry NEUROLOGIC:  Alert and oriented x 3 PSYCHIATRIC:  Normal affect  Assessment & Plan Bradycardia AV nodal blockers have been avoided in the past.  Does not currently appear to be a significant issue.  Reviewed most recent EKGs demonstrating sinus rhythm. Palpitations-nocturnal Stable, minimally symptomatic. Continue observation.  Mixed hyperlipidemia She reports that she was quite reluctant to start on a statin but she is tolerating rosuvastatin 5 mg daily.  LDL cholesterol is 88.  She has no history of CAD and has no family  history of ischemic heart disease. Essential hypertension Blood pressure well-controlled on olmesartan/HCT.  Continue current management.  Creatinine 0.9, potassium 3.7.      Medication Adjustments/Labs and Tests Ordered: Current medicines are reviewed at length with the patient today.  Concerns regarding medicines are outlined above.  No orders of the defined types were placed in this encounter.  No orders of the defined types were placed in this encounter.   Patient Instructions  Medication Instructions:  Your physician recommends that you continue on your current medications as directed. Please refer to the Current Medication list given to you today.  *If you need a refill on your cardiac medications before your next appointment, please call your pharmacy*  Follow-Up: At [facility], you and your health needs are our priority.  As part of our continuing mission to provide you with exceptional heart care, our providers are all part of one team.  This team includes your primary Cardiologist (physician) and Advanced Practice Providers or APPs (Physician Assistants and Nurse Practitioners) who all work together to provide you with the care you need, when you need it.  Your next appointment:   1 year(s)  Provider:   Ozell Fell, MD      Signed, Ozell Fell, MD  01/23/2024 12:39 PM    [facility]

## 2024-01-22 NOTE — Assessment & Plan Note (Signed)
 AV nodal blockers have been avoided in the past.

## 2024-01-23 ENCOUNTER — Ambulatory Visit: Attending: Cardiovascular Disease | Admitting: Cardiovascular Disease

## 2024-01-23 ENCOUNTER — Encounter: Payer: Self-pay | Admitting: Cardiovascular Disease

## 2024-01-23 VITALS — BP 133/84 | HR 72 | Ht 64.5 in | Wt 176.2 lb

## 2024-01-23 DIAGNOSIS — E782 Mixed hyperlipidemia: Secondary | ICD-10-CM | POA: Diagnosis not present

## 2024-01-23 DIAGNOSIS — I10 Essential (primary) hypertension: Secondary | ICD-10-CM | POA: Diagnosis not present

## 2024-01-23 DIAGNOSIS — R002 Palpitations: Secondary | ICD-10-CM | POA: Diagnosis not present

## 2024-01-23 DIAGNOSIS — R001 Bradycardia, unspecified: Secondary | ICD-10-CM | POA: Diagnosis not present

## 2024-01-23 NOTE — Assessment & Plan Note (Signed)
 Stable, minimally symptomatic. Continue observation.

## 2024-01-23 NOTE — Patient Instructions (Addendum)
 Medication Instructions:   Your physician recommends that you continue on your current medications as directed. Please refer to the Current Medication list given to you today.  *If you need a refill on your cardiac medications before your next appointment, please call your pharmacy*    Follow-Up: At [facility], you and your health needs are our priority.  As part of our continuing mission to provide you with exceptional heart care, our providers are all part of one team.  This team includes your primary Cardiologist (physician) and Advanced Practice Providers or APPs (Physician Assistants and Nurse Practitioners) who all work together to provide you with the care you need, when you need it.  Your next appointment:   1 year(s)  Provider:   Arnoldo Lapping, MD

## 2024-01-27 DIAGNOSIS — R6882 Decreased libido: Secondary | ICD-10-CM | POA: Diagnosis not present

## 2024-01-27 DIAGNOSIS — Z01419 Encounter for gynecological examination (general) (routine) without abnormal findings: Secondary | ICD-10-CM | POA: Diagnosis not present

## 2024-01-27 DIAGNOSIS — Z6830 Body mass index (BMI) 30.0-30.9, adult: Secondary | ICD-10-CM | POA: Diagnosis not present

## 2024-07-05 ENCOUNTER — Ambulatory Visit: Admitting: Neurology

## 2024-07-05 ENCOUNTER — Ambulatory Visit: Admitting: Family Medicine
